# Patient Record
Sex: Female | Born: 1955 | Race: White | Hispanic: No | Marital: Married | Smoking: Never smoker
Health system: Southern US, Community
[De-identification: ages and names within clinical notes are randomized; demographics above are authoritative.]

## PROBLEM LIST (undated history)

## (undated) ENCOUNTER — Ambulatory Visit: Admission: EM | Payer: Self-pay | Source: Home / Self Care

## (undated) DIAGNOSIS — Z923 Personal history of irradiation: Secondary | ICD-10-CM

## (undated) DIAGNOSIS — R51 Headache: Secondary | ICD-10-CM

## (undated) DIAGNOSIS — I214 Non-ST elevation (NSTEMI) myocardial infarction: Secondary | ICD-10-CM

## (undated) DIAGNOSIS — I2510 Atherosclerotic heart disease of native coronary artery without angina pectoris: Secondary | ICD-10-CM

## (undated) DIAGNOSIS — K219 Gastro-esophageal reflux disease without esophagitis: Secondary | ICD-10-CM

## (undated) DIAGNOSIS — M1990 Unspecified osteoarthritis, unspecified site: Secondary | ICD-10-CM

## (undated) DIAGNOSIS — C801 Malignant (primary) neoplasm, unspecified: Secondary | ICD-10-CM

## (undated) DIAGNOSIS — R519 Headache, unspecified: Secondary | ICD-10-CM

## (undated) DIAGNOSIS — I5022 Chronic systolic (congestive) heart failure: Secondary | ICD-10-CM

## (undated) DIAGNOSIS — F419 Anxiety disorder, unspecified: Secondary | ICD-10-CM

## (undated) HISTORY — DX: Anxiety disorder, unspecified: F41.9

## (undated) HISTORY — PX: CHOLECYSTECTOMY: SHX55

## (undated) HISTORY — PX: BILATERAL CARPAL TUNNEL RELEASE: SHX6508

## (undated) HISTORY — DX: Chronic systolic (congestive) heart failure: I50.22

## (undated) HISTORY — DX: Atherosclerotic heart disease of native coronary artery without angina pectoris: I25.10

## (undated) HISTORY — DX: Non-ST elevation (NSTEMI) myocardial infarction: I21.4

---

## 2016-02-02 ENCOUNTER — Other Ambulatory Visit: Payer: Self-pay | Admitting: Obstetrics and Gynecology

## 2016-02-02 DIAGNOSIS — Z1231 Encounter for screening mammogram for malignant neoplasm of breast: Secondary | ICD-10-CM

## 2016-03-01 ENCOUNTER — Ambulatory Visit
Admission: RE | Admit: 2016-03-01 | Discharge: 2016-03-01 | Disposition: A | Discharge status: Home / Self Care | Payer: No Typology Code available for payment source | Source: Ambulatory Visit | Attending: Obstetrics and Gynecology | Admitting: Obstetrics and Gynecology

## 2016-03-01 ENCOUNTER — Encounter (HOSPITAL_COMMUNITY): Payer: Self-pay

## 2016-03-01 DIAGNOSIS — Z1231 Encounter for screening mammogram for malignant neoplasm of breast: Secondary | ICD-10-CM | POA: Diagnosis not present

## 2016-03-14 ENCOUNTER — Other Ambulatory Visit: Payer: Self-pay | Admitting: *Deleted

## 2016-03-14 ENCOUNTER — Ambulatory Visit
Admission: RE | Admit: 2016-03-14 | Discharge: 2016-03-14 | Disposition: A | Discharge status: Home / Self Care | Payer: Self-pay | Source: Ambulatory Visit | Attending: *Deleted | Admitting: *Deleted

## 2016-03-14 DIAGNOSIS — Z9289 Personal history of other medical treatment: Secondary | ICD-10-CM

## 2016-03-17 ENCOUNTER — Other Ambulatory Visit: Payer: Self-pay | Admitting: Obstetrics and Gynecology

## 2016-03-17 DIAGNOSIS — N6320 Unspecified lump in the left breast, unspecified quadrant: Secondary | ICD-10-CM

## 2016-03-23 ENCOUNTER — Ambulatory Visit
Admission: RE | Admit: 2016-03-23 | Discharge: 2016-03-23 | Disposition: A | Discharge status: Home / Self Care | Payer: No Typology Code available for payment source | Source: Ambulatory Visit | Attending: Obstetrics and Gynecology | Admitting: Obstetrics and Gynecology

## 2016-03-23 ENCOUNTER — Other Ambulatory Visit: Payer: Self-pay | Admitting: Obstetrics and Gynecology

## 2016-03-23 DIAGNOSIS — N6320 Unspecified lump in the left breast, unspecified quadrant: Secondary | ICD-10-CM

## 2016-03-23 DIAGNOSIS — C801 Malignant (primary) neoplasm, unspecified: Secondary | ICD-10-CM

## 2016-03-23 HISTORY — DX: Malignant (primary) neoplasm, unspecified: C80.1

## 2016-03-31 ENCOUNTER — Ambulatory Visit
Admission: RE | Admit: 2016-03-31 | Discharge: 2016-03-31 | Disposition: A | Discharge status: Home / Self Care | Payer: No Typology Code available for payment source | Source: Ambulatory Visit | Attending: Obstetrics and Gynecology | Admitting: Obstetrics and Gynecology

## 2016-03-31 ENCOUNTER — Encounter: Payer: Self-pay | Admitting: Oncology

## 2016-03-31 DIAGNOSIS — N6320 Unspecified lump in the left breast, unspecified quadrant: Secondary | ICD-10-CM | POA: Diagnosis present

## 2016-03-31 DIAGNOSIS — C50912 Malignant neoplasm of unspecified site of left female breast: Secondary | ICD-10-CM | POA: Diagnosis not present

## 2016-03-31 HISTORY — PX: BREAST BIOPSY: SHX20

## 2016-04-07 LAB — SURGICAL PATHOLOGY

## 2016-04-07 NOTE — Progress Notes (Signed)
  Oncology Nurse Navigator Documentation  Navigator Location: [facility]-Med Onc (04/07/16 1500) Referral date to RadOnc/MedOnc: 04/12/16 (04/07/16 1500) )Navigator Encounter Type: Introductory phone call (04/07/16 1500)   Abnormal Finding Date: 03/23/16 (04/07/16 1500) Confirmed Diagnosis Date: 03/31/16 (04/07/16 1500)               Patient Visit Type: Initial (04/07/16 1500)                              Time Spent with Patient: 60 (04/07/16 1500)  Per [facility] Radiology, patient has been notified with biopsy results of left breast invasive mammary carcinoma.  Phoned patient to introduce navigation service. Scheduled for consult with Dr. Rochel Brome on 04/08/16 at 3:00, and Dr. Grayland Ormond 04/12/16 at 9:00.  Patient has 5 children.  States her husband is more anxious than she, and he will accompany to Med Onc appointment.

## 2016-04-10 DIAGNOSIS — C50412 Malignant neoplasm of upper-outer quadrant of left female breast: Secondary | ICD-10-CM | POA: Insufficient documentation

## 2016-04-10 NOTE — Progress Notes (Signed)
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  INTERVAL HISTORY: Patient is a 60 year old female who recently underwent her first screening mammogram in approximately 4 years and was found to have an abnormality. Subsequent ultrasound biopsy revealed the above stated breast cancer. Currently, she feels well and is asymptomatic. She has no neurologic complaints. She has a good appetite and denies any weight loss. She has no recent fevers or illnesses. She denies any chest pain or shortness of breath. She denies any nausea, vomiting, constipation, or diarrhea. She has no urinary complaints. Patient feels at her baseline and offers no specific complaints today.  REVIEW OF SYSTEMS:   Review of Systems  Constitutional: Negative.  Negative for fever, malaise/fatigue and weight loss.  Respiratory: Negative.  Negative for cough.   Cardiovascular: Negative.  Negative for chest pain and leg swelling.  Gastrointestinal: Negative.  Negative for abdominal pain.  Genitourinary: Negative.   Musculoskeletal: Negative.   Neurological: Negative.  Negative for weakness.  Psychiatric/Behavioral: Negative.  The patient is not nervous/anxious.     As per HPI. Otherwise, a complete review of systems is negative.  PAST MEDICAL HISTORY: Past Medical History:  Diagnosis Date  . Anxiety     PAST SURGICAL HISTORY: Past Surgical History:  Procedure Laterality Date  . BREAST BIOPSY Left 03/31/2016   path pending  . CHOLECYSTECTOMY      FAMILY HISTORY: Family History  Problem Relation Age of Onset  . Breast cancer Neg Hx     ADVANCED DIRECTIVES (Y/N):  N  HEALTH MAINTENANCE: Social History  Substance Use Topics  . Smoking status:  Never Smoker  . Smokeless tobacco: Not on file  . Alcohol use Yes     Comment: occasional     Colonoscopy:  PAP:  Bone density:  Lipid panel:  No Known Allergies  Current Outpatient Prescriptions  Medication Sig Dispense Refill  . aspirin 81 MG chewable tablet Chew by mouth.    . Probiotic Product ([place]) Take by mouth.     No current facility-administered medications for this visit.     OBJECTIVE: Vitals:   04/12/16 0914  BP: 137/82  Pulse: 64  Resp: 18  Temp: 97.6 F (36.4 C)     Body mass index is 31.09 kg/m.    ECOG FS:0 - Asymptomatic  General: Well-developed, well-nourished, no acute distress. Eyes: Pink conjunctiva, anicteric sclera. HEENT: Normocephalic, moist mucous membranes, clear oropharnyx. Breasts: Patient requested exam be deferred today. Lungs: Clear to auscultation bilaterally. Heart: Regular rate and rhythm. No rubs, murmurs, or gallops. Abdomen: Soft, nontender, nondistended. No organomegaly noted, normoactive bowel sounds. Musculoskeletal: No edema, cyanosis, or clubbing. Neuro: Alert, answering all questions appropriately. Cranial nerves grossly intact. Skin: No rashes or petechiae noted. Psych: Normal affect. Lymphatics: No cervical, calvicular, axillary or inguinal LAD.   LAB RESULTS:  No results found for: NA, K, CL, CO2, GLUCOSE, BUN, CREATININE, CALCIUM, PROT, ALBUMIN, AST, ALT, ALKPHOS, BILITOT, GFRNONAA, GFRAA  No results found for: WBC, NEUTROABS, HGB, HCT, MCV, PLT   STUDIES: US Breast Ltd Uni Left Inc Axilla  Result Date: 03/23/2016 CLINICAL DATA:  The patient returns after screening study for evaluation of possible left breast mass. EXAM: 2D DIGITAL DIAGNOSTIC LEFT MAMMOGRAM WITH CAD AND ADJUNCT TOMO ULTRASOUND  LEFT BREAST COMPARISON:  03/01/2016 and 07/18/2011 ( [facility]) ACR Breast Density Category c: The breast tissue is heterogeneously dense, which may obscure small masses.  FINDINGS: Tomosynthesis images confirm presence of a spiculated mass in the upper-outer quadrant of the right breast. Mammographic images were processed with CAD. On physical exam, I palpate soft thickening in the 2 o'clock location of the left breast. Targeted ultrasound is performed, showing irregular hypoechoic taller than wide mass in the 2 o'clock location of the left breast 4 cm from nipple which measures 1.1 x 1.1 x 0.8 cm. There is adjacent vascularity on Doppler evaluation. No enlarged axillary lymph nodes identified by ultrasound. IMPRESSION: Suspicious mass in the 2 o'clock location of the left breast 4 cm from the nipple corresponding to the spiculated mass identified mammographically. No imaging evidence for adenopathy. RECOMMENDATION: Ultrasound-guided core biopsy of mass in the 2 o'clock location of the left breast. I have discussed the findings and recommendations with the patient. Results were also provided in writing at the conclusion of the visit. If applicable, a reminder letter will be sent to the patient regarding the next appointment. BI-RADS CATEGORY  4: Suspicious. Electronically Signed   By: Nolon Nations M.D.   On: 03/23/2016 12:04   Mm Diag Breast Tomo Uni Left  Result Date: 03/23/2016 CLINICAL DATA:  The patient returns after screening study for evaluation of possible left breast mass. EXAM: 2D DIGITAL DIAGNOSTIC LEFT MAMMOGRAM WITH CAD AND ADJUNCT TOMO ULTRASOUND LEFT BREAST COMPARISON:  03/01/2016 and 07/18/2011 ( [facility]) ACR Breast Density Category c: The breast tissue is heterogeneously dense, which may obscure small masses. FINDINGS: Tomosynthesis images confirm presence of a spiculated mass in the upper-outer quadrant of the right breast. Mammographic images were processed with CAD. On physical exam, I palpate soft thickening in the 2 o'clock location of the left breast. Targeted ultrasound is performed, showing irregular hypoechoic taller than  wide mass in the 2 o'clock location of the left breast 4 cm from nipple which measures 1.1 x 1.1 x 0.8 cm. There is adjacent vascularity on Doppler evaluation. No enlarged axillary lymph nodes identified by ultrasound. IMPRESSION: Suspicious mass in the 2 o'clock location of the left breast 4 cm from the nipple corresponding to the spiculated mass identified mammographically. No imaging evidence for adenopathy. RECOMMENDATION: Ultrasound-guided core biopsy of mass in the 2 o'clock location of the left breast. I have discussed the findings and recommendations with the patient. Results were also provided in writing at the conclusion of the visit. If applicable, a reminder letter will be sent to the patient regarding the next appointment. BI-RADS CATEGORY  4: Suspicious. Electronically Signed   By: Nolon Nations M.D.   On: 03/23/2016 12:04   Mm Clip Placement Left  Result Date: 03/31/2016 CLINICAL DATA:  Evaluate biopsy clip EXAM: DIAGNOSTIC LEFT MAMMOGRAM POST ULTRASOUND BIOPSY COMPARISON:  Previous exam(s). FINDINGS: Mammographic images were obtained following ultrasound guided biopsy of a left breast mass. The coil shaped clip is within the biopsied mass. IMPRESSION: Appropriate clip placement after biopsy. Final Assessment: Post Procedure Mammograms for Marker Placement Electronically Signed   By: Dorise Bullion III M.D   On: 03/31/2016 11:40   [place] Lt Breast Bx W Loc Dev 1st Lesion Img Bx Spec US Guide  Addendum Date: 04/07/2016   ADDENDUM REPORT: 04/07/2016 14:28 ADDENDUM: Pathology of the left breast biopsy revealed LEFT BREAST, 2:00, 4 CMFN; BIOPSY: INVASIVE MAMMARY CARCINOMA OF NO SPECIAL TYPE. PRELIMINARY GRADE:  1 ([place] HISTOLOGIC GRADE). 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 Note: ER, PR and HER2-neu immunohistochemistry is obtained and results will be issued in an addendum. HER2 FISH will be performed for equivocal results. Final histologic grade should be based on  the excised tumor. Results were called to Eino Farber in the Radiology Department on 04/01/16 at 1:53 PM. This was found to be concordant by Dr. Jimmye Norman. Recommendations:  Surgical and oncology referrals. Results and recommendations were relayed to Dr. Migdalia Dk nurse by phone by Eino Farber, RT, M, mammography supervisor at [facility]. The nurse requested the pathology report be faxed to their office. She was going to discuss results with Dr. Leafy Ro and notify Eino Farber as to relaying results to the patient and making referral. The patient was contacted by phone with results and recommendations by Dr. Enriqueta Shutter on 04/06/16. The patient stated she has done well following the biopsy with only bruising which has started to resolve. All of her questions were answered. She was informed that the nurse navigators from [facility] would contact her with referral information. She was encouraged to contact the [facility] with any further questions or concerns. An appointment was made with Dr. Grayland Ormond, oncologist for 04/08/16 at 9:00 AM and Dr. Tamala Julian, surgeon for 04/08/16 at 3:00 PM by Al Pimple, RN, nurse navigator. The patient has been notified of the appointment information. Addendum by Jetta Lout, RRA on 04/07/16. Electronically Signed   By: Franki Cabot M.D.   On: 04/07/2016 14:28   Result Date: 04/07/2016 CLINICAL DATA:  Left breast mass EXAM: ULTRASOUND GUIDED LEFT BREAST CORE NEEDLE BIOPSY COMPARISON:  Previous exam(s). FINDINGS: I met with the patient and we discussed the procedure of ultrasound-guided biopsy, including benefits and alternatives. We discussed the high likelihood of a successful procedure. We discussed the risks of the procedure, including infection, bleeding, tissue injury, clip migration, and inadequate sampling. Informed written consent was given. The usual time-out protocol was performed immediately prior to the procedure.  Using sterile technique and 1% Lidocaine as local anesthetic, under direct ultrasound visualization, a 12 gauge spring-loaded device was used to perform biopsy of a left breast mass using a lateral approach. At the conclusion of the procedure a tissue marker clip was deployed into the biopsy cavity. Follow up 2 view mammogram was performed and dictated separately. IMPRESSION: Ultrasound guided biopsy of a left breast mass. No apparent complications. Electronically Signed: By: Dorise Bullion III M.D On: 03/31/2016 11:39   Mm Outside Films Mammo  Result Date: 03/14/2016 CLINICAL DATA:  This exam is stored here for comparison purposes only and was performed at an outside facility.   Please contact the originating institution for any associated interpretation or report.    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  PLAN:    1. 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 Have ordered Mammoprint to assess whether chemotherapy is necessary or not. Patient has elected for lumpectomy, therefore she will require adjuvant XRT and has an appointment with radiation oncology in mid-December. Also, given the ER/PR status of patient's tumor, she will benefit from an aromatase and from inhibitor for 5 years after completion of her XRT. Patient will return to clinic  on May 09, 2016 to discuss her final pathology results and any additional treatment necessary.  Approximately 45 minutes was spent in discussion of which greater than 50% was consultation.  Patient expressed understanding and was in agreement with this plan. She also understands that She can call clinic at any time with any questions, concerns, or  complaints.   Primary cancer of upper outer quadrant of left female breast [facility])   Staging form: Breast, AJCC 7th Edition   - Clinical stage from 04/10/2016: Stage IA (T1c, N0, M0) - Signed by Lloyd Huger, MD on 04/10/2016  Lloyd Huger, MD   04/12/2016 1:19 PM

## 2016-04-11 ENCOUNTER — Other Ambulatory Visit: Payer: Self-pay | Admitting: Surgery

## 2016-04-11 DIAGNOSIS — C50412 Malignant neoplasm of upper-outer quadrant of left female breast: Secondary | ICD-10-CM

## 2016-04-12 ENCOUNTER — Encounter: Payer: Self-pay | Admitting: Oncology

## 2016-04-12 ENCOUNTER — Encounter (INDEPENDENT_AMBULATORY_CARE_PROVIDER_SITE_OTHER): Payer: Self-pay

## 2016-04-12 ENCOUNTER — Inpatient Hospital Stay: Payer: No Typology Code available for payment source | Attending: Oncology | Admitting: Oncology

## 2016-04-12 ENCOUNTER — Encounter: Payer: Self-pay | Admitting: Surgical Oncology

## 2016-04-12 DIAGNOSIS — Z170 Estrogen receptor positive status [ER+]: Secondary | ICD-10-CM

## 2016-04-12 DIAGNOSIS — F419 Anxiety disorder, unspecified: Secondary | ICD-10-CM | POA: Insufficient documentation

## 2016-04-12 DIAGNOSIS — Z79899 Other long term (current) drug therapy: Secondary | ICD-10-CM | POA: Insufficient documentation

## 2016-04-12 DIAGNOSIS — C50412 Malignant neoplasm of upper-outer quadrant of left female breast: Secondary | ICD-10-CM

## 2016-04-12 DIAGNOSIS — Z7982 Long term (current) use of aspirin: Secondary | ICD-10-CM | POA: Insufficient documentation

## 2016-04-22 NOTE — Patient Instructions (Signed)
  Your procedure is scheduled on: 04-29-16 (FRIDAY) Report to [place] @ 8:30 AM   Remember: Instructions that are not followed completely may result in serious medical risk, up to and including death, or upon the discretion of your surgeon and anesthesiologist your surgery may need to be rescheduled.    _x___ 1. Do not eat food or drink liquids after midnight. No gum chewing or hard candies.     __x__ 2. No Alcohol for 24 hours before or after surgery.   __x__3. No Smoking for 24 prior to surgery.   ____  4. Bring all medications with you on the day of surgery if instructed.    __x__ 5. Notify your doctor if there is any change in your medical condition     (cold, fever, infections).     Do not wear jewelry, make-up, hairpins, clips or nail polish.  Do not wear lotions, powders, or perfumes. You may wear deodorant.  Do not shave 48 hours prior to surgery. Men may shave face and neck.  Do not bring valuables to the hospital.    [facility] is not responsible for any belongings or valuables.               Contacts, dentures or bridgework may not be worn into surgery.  Leave your suitcase in the car. After surgery it may be brought to your room.  For patients admitted to the hospital, discharge time is determined by your treatment team.   Patients discharged the day of surgery will not be allowed to drive home.  You will need someone to drive you home and stay with you the night of your procedure.    Please read over the following fact sheets that you were given:   [facility] Preparing for Surgery and or MRSA Information   ____ Take these medicines the morning of surgery with A SIP OF WATER:    1. NONE  2.  3.  4.  5.  6.  ____Fleets enema or Magnesium Citrate as directed.   _x___ Use CHG Soap or sage wipes as directed on instruction sheet   ____ Use inhalers on the day of surgery and bring to hospital day of surgery  ____ Stop metformin 2 days prior to  surgery    ____ Take 1/2 of usual insulin dose the night before surgery and none on the morning of surgery.   _X___ Stop Aspirin, Coumadin, Pllavix ,Eliquis, Effient, or Pradaxa-STOP ASPIRIN NOW  x__ Stop Anti-inflammatories such as Advil, Aleve, Ibuprofen, Motrin, Naproxen,          Naprosyn, Goodies powders or aspirin products NOW-Ok to take Tylenol.   _X___ Stop supplements until after surgery-STOP PROBIOTIC NOW  ____ Bring C-Pap to the hospital.

## 2016-04-25 ENCOUNTER — Encounter
Admission: RE | Admit: 2016-04-25 | Discharge: 2016-04-25 | Disposition: A | Discharge status: Home / Self Care | Payer: PRIVATE HEALTH INSURANCE | Source: Ambulatory Visit | Attending: Surgery | Admitting: Surgery

## 2016-04-25 DIAGNOSIS — N6002 Solitary cyst of left breast: Secondary | ICD-10-CM | POA: Diagnosis not present

## 2016-04-25 DIAGNOSIS — F419 Anxiety disorder, unspecified: Secondary | ICD-10-CM | POA: Diagnosis not present

## 2016-04-25 DIAGNOSIS — Z01812 Encounter for preprocedural laboratory examination: Secondary | ICD-10-CM | POA: Insufficient documentation

## 2016-04-25 DIAGNOSIS — M1990 Unspecified osteoarthritis, unspecified site: Secondary | ICD-10-CM | POA: Diagnosis not present

## 2016-04-25 DIAGNOSIS — E669 Obesity, unspecified: Secondary | ICD-10-CM | POA: Diagnosis not present

## 2016-04-25 DIAGNOSIS — Z170 Estrogen receptor positive status [ER+]: Secondary | ICD-10-CM | POA: Diagnosis not present

## 2016-04-25 DIAGNOSIS — C50912 Malignant neoplasm of unspecified site of left female breast: Secondary | ICD-10-CM | POA: Diagnosis not present

## 2016-04-25 DIAGNOSIS — Z6831 Body mass index (BMI) 31.0-31.9, adult: Secondary | ICD-10-CM | POA: Diagnosis not present

## 2016-04-25 DIAGNOSIS — K219 Gastro-esophageal reflux disease without esophagitis: Secondary | ICD-10-CM | POA: Diagnosis not present

## 2016-04-25 DIAGNOSIS — C50412 Malignant neoplasm of upper-outer quadrant of left female breast: Secondary | ICD-10-CM | POA: Insufficient documentation

## 2016-04-25 HISTORY — DX: Unspecified osteoarthritis, unspecified site: M19.90

## 2016-04-25 HISTORY — DX: Headache: R51

## 2016-04-25 HISTORY — DX: Malignant (primary) neoplasm, unspecified: C80.1

## 2016-04-25 HISTORY — DX: Gastro-esophageal reflux disease without esophagitis: K21.9

## 2016-04-25 HISTORY — DX: Headache, unspecified: R51.9

## 2016-04-25 LAB — POTASSIUM: POTASSIUM: 3.9 mmol/L (ref 3.5–5.1)

## 2016-04-29 ENCOUNTER — Encounter: Payer: Self-pay | Admitting: *Deleted

## 2016-04-29 ENCOUNTER — Ambulatory Visit
Admission: RE | Admit: 2016-04-29 | Discharge: 2016-04-29 | Disposition: A | Discharge status: Home / Self Care | Payer: No Typology Code available for payment source | Source: Ambulatory Visit | Attending: Surgery | Admitting: Surgery

## 2016-04-29 ENCOUNTER — Ambulatory Visit: Payer: No Typology Code available for payment source | Admitting: Anesthesiology

## 2016-04-29 ENCOUNTER — Encounter
Admission: RE | Disposition: A | Discharge status: Home / Self Care | Payer: Self-pay | Source: Ambulatory Visit | Attending: Surgery

## 2016-04-29 DIAGNOSIS — E669 Obesity, unspecified: Secondary | ICD-10-CM | POA: Insufficient documentation

## 2016-04-29 DIAGNOSIS — C50912 Malignant neoplasm of unspecified site of left female breast: Secondary | ICD-10-CM | POA: Diagnosis not present

## 2016-04-29 DIAGNOSIS — C50412 Malignant neoplasm of upper-outer quadrant of left female breast: Secondary | ICD-10-CM

## 2016-04-29 DIAGNOSIS — M1990 Unspecified osteoarthritis, unspecified site: Secondary | ICD-10-CM | POA: Insufficient documentation

## 2016-04-29 DIAGNOSIS — N6002 Solitary cyst of left breast: Secondary | ICD-10-CM | POA: Insufficient documentation

## 2016-04-29 DIAGNOSIS — Z170 Estrogen receptor positive status [ER+]: Secondary | ICD-10-CM | POA: Insufficient documentation

## 2016-04-29 DIAGNOSIS — F419 Anxiety disorder, unspecified: Secondary | ICD-10-CM | POA: Insufficient documentation

## 2016-04-29 DIAGNOSIS — K219 Gastro-esophageal reflux disease without esophagitis: Secondary | ICD-10-CM | POA: Insufficient documentation

## 2016-04-29 DIAGNOSIS — Z6831 Body mass index (BMI) 31.0-31.9, adult: Secondary | ICD-10-CM | POA: Insufficient documentation

## 2016-04-29 HISTORY — PX: BREAST LUMPECTOMY: SHX2

## 2016-04-29 HISTORY — PX: SENTINEL NODE BIOPSY: SHX6608

## 2016-04-29 HISTORY — PX: PARTIAL MASTECTOMY WITH NEEDLE LOCALIZATION: SHX6008

## 2016-04-29 SURGERY — PARTIAL MASTECTOMY WITH NEEDLE LOCALIZATION
Anesthesia: General | Laterality: Left

## 2016-04-29 MED ORDER — TECHNETIUM TC 99M SULFUR COLLOID
1.1000 | Freq: Once | INTRAVENOUS | Status: AC | PRN
  Administered 2016-04-29: 1.1 via INTRAVENOUS

## 2016-04-29 MED ORDER — BUPIVACAINE HCL (PF) 0.5 % IJ SOLN
INTRAMUSCULAR | Status: AC
  Filled 2016-04-29: qty 30

## 2016-04-29 MED ORDER — PHENYLEPHRINE HCL 10 MG/ML IJ SOLN
INTRAMUSCULAR | Status: DC | PRN
  Administered 2016-04-29 (×6): 100 ug via INTRAVENOUS

## 2016-04-29 MED ORDER — FENTANYL CITRATE (PF) 100 MCG/2ML IJ SOLN
25.0000 ug | INTRAMUSCULAR | Status: DC | PRN
  Administered 2016-04-29: 50 ug via INTRAVENOUS
  Administered 2016-04-29: 25 ug via INTRAVENOUS
  Administered 2016-04-29: 50 ug via INTRAVENOUS
  Administered 2016-04-29: 25 ug via INTRAVENOUS

## 2016-04-29 MED ORDER — LACTATED RINGERS IV SOLN
INTRAVENOUS | Status: DC
  Administered 2016-04-29 (×2): via INTRAVENOUS

## 2016-04-29 MED ORDER — ONDANSETRON HCL 4 MG/2ML IJ SOLN
INTRAMUSCULAR | Status: DC | PRN
  Administered 2016-04-29: 4 mg via INTRAVENOUS

## 2016-04-29 MED ORDER — OXYCODONE HCL 5 MG/5ML PO SOLN: 5.0000 mg | Freq: Once | ORAL | Status: AC | PRN

## 2016-04-29 MED ORDER — PROMETHAZINE HCL 25 MG/ML IJ SOLN: 6.2500 mg | INTRAMUSCULAR | Status: DC | PRN

## 2016-04-29 MED ORDER — MEPERIDINE HCL 25 MG/ML IJ SOLN: 6.2500 mg | INTRAMUSCULAR | Status: DC | PRN

## 2016-04-29 MED ORDER — GLYCOPYRROLATE 0.2 MG/ML IJ SOLN
INTRAMUSCULAR | Status: DC | PRN
  Administered 2016-04-29: .2 mg via INTRAVENOUS

## 2016-04-29 MED ORDER — EPINEPHRINE 30 MG/30ML IJ SOLN
INTRAMUSCULAR | Status: AC
  Filled 2016-04-29: qty 1

## 2016-04-29 MED ORDER — FENTANYL CITRATE (PF) 100 MCG/2ML IJ SOLN
INTRAMUSCULAR | Status: AC
  Administered 2016-04-29: 50 ug via INTRAVENOUS
  Filled 2016-04-29: qty 2

## 2016-04-29 MED ORDER — MIDAZOLAM HCL 2 MG/2ML IJ SOLN
INTRAMUSCULAR | Status: DC | PRN
  Administered 2016-04-29: 2 mg via INTRAVENOUS

## 2016-04-29 MED ORDER — FENTANYL CITRATE (PF) 100 MCG/2ML IJ SOLN
INTRAMUSCULAR | Status: AC
  Administered 2016-04-29: 25 ug via INTRAVENOUS
  Filled 2016-04-29: qty 2

## 2016-04-29 MED ORDER — FENTANYL CITRATE (PF) 100 MCG/2ML IJ SOLN
INTRAMUSCULAR | Status: DC | PRN
  Administered 2016-04-29: 50 ug via INTRAVENOUS
  Administered 2016-04-29 (×2): 25 ug via INTRAVENOUS

## 2016-04-29 MED ORDER — HYDROCODONE-ACETAMINOPHEN 5-325 MG PO TABS: 1.0000 | ORAL_TABLET | ORAL | 0 refills | Status: DC | PRN

## 2016-04-29 MED ORDER — KETOROLAC TROMETHAMINE 30 MG/ML IJ SOLN
INTRAMUSCULAR | Status: DC | PRN
  Administered 2016-04-29: 30 mg via INTRAVENOUS

## 2016-04-29 MED ORDER — HYDROCODONE-ACETAMINOPHEN 5-325 MG PO TABS: 1.0000 | ORAL_TABLET | ORAL | Status: DC | PRN

## 2016-04-29 MED ORDER — LIDOCAINE HCL (CARDIAC) 20 MG/ML IV SOLN
INTRAVENOUS | Status: DC | PRN
  Administered 2016-04-29: 40 mg via INTRAVENOUS

## 2016-04-29 MED ORDER — EPINEPHRINE PF 1 MG/ML IJ SOLN
INTRAMUSCULAR | Status: AC
  Filled 2016-04-29: qty 1

## 2016-04-29 MED ORDER — FAMOTIDINE 20 MG PO TABS
ORAL_TABLET | ORAL | Status: AC
  Filled 2016-04-29: qty 1

## 2016-04-29 MED ORDER — DEXAMETHASONE SODIUM PHOSPHATE 10 MG/ML IJ SOLN
INTRAMUSCULAR | Status: DC | PRN
  Administered 2016-04-29: 8 mg via INTRAVENOUS

## 2016-04-29 MED ORDER — OXYCODONE HCL 5 MG PO TABS
5.0000 mg | ORAL_TABLET | Freq: Once | ORAL | Status: AC | PRN
  Administered 2016-04-29: 5 mg via ORAL

## 2016-04-29 MED ORDER — BUPIVACAINE-EPINEPHRINE 0.5% -1:200000 IJ SOLN
INTRAMUSCULAR | Status: DC | PRN
  Administered 2016-04-29: 13 mL

## 2016-04-29 MED ORDER — OXYCODONE HCL 5 MG PO TABS
ORAL_TABLET | ORAL | Status: AC
  Filled 2016-04-29: qty 1

## 2016-04-29 MED ORDER — FAMOTIDINE 20 MG PO TABS
20.0000 mg | ORAL_TABLET | Freq: Once | ORAL | Status: AC
  Administered 2016-04-29: 20 mg via ORAL

## 2016-04-29 MED ORDER — PROPOFOL 10 MG/ML IV BOLUS
INTRAVENOUS | Status: DC | PRN
  Administered 2016-04-29: 130 mg via INTRAVENOUS
  Administered 2016-04-29: 40 mg via INTRAVENOUS

## 2016-04-29 SURGICAL SUPPLY — 32 items
BLADE SURG 15 STRL LF DISP TIS (BLADE) ×1 IMPLANT
BLADE SURG 15 STRL SS (BLADE) ×2
CANISTER SUCT 1200ML W/VALVE (MISCELLANEOUS) ×3 IMPLANT
CHLORAPREP W/TINT 26ML (MISCELLANEOUS) ×3 IMPLANT
CNTNR SPEC 2.5X3XGRAD LEK (MISCELLANEOUS) ×2
CONT SPEC 4OZ STER OR WHT (MISCELLANEOUS) ×4
CONTAINER SPEC 2.5X3XGRAD LEK (MISCELLANEOUS) ×2 IMPLANT
DERMABOND ADVANCED (GAUZE/BANDAGES/DRESSINGS) ×2
DERMABOND ADVANCED .7 DNX12 (GAUZE/BANDAGES/DRESSINGS) ×1 IMPLANT
DEVICE DUBIN SPECIMEN MAMMOGRA (MISCELLANEOUS) ×3 IMPLANT
DRAPE LAPAROTOMY 77X122 PED (DRAPES) ×3 IMPLANT
ELECT REM PT RETURN 9FT ADLT (ELECTROSURGICAL) ×3
ELECTRODE REM PT RTRN 9FT ADLT (ELECTROSURGICAL) ×1 IMPLANT
GLOVE BIO SURGEON STRL SZ7.5 (GLOVE) ×3 IMPLANT
GOWN STRL REUS W/ TWL LRG LVL3 (GOWN DISPOSABLE) ×2 IMPLANT
GOWN STRL REUS W/TWL LRG LVL3 (GOWN DISPOSABLE) ×4
KIT RM TURNOVER STRD PROC AR (KITS) ×3 IMPLANT
LABEL OR SOLS (LABEL) ×3 IMPLANT
MARGIN MAP 10MM (MISCELLANEOUS) ×3 IMPLANT
NDL SAFETY 18GX1.5 (NEEDLE) ×3 IMPLANT
NDL SAFETY 22GX1.5 (NEEDLE) ×3 IMPLANT
NEEDLE HYPO 25X1 1.5 SAFETY (NEEDLE) ×3 IMPLANT
PACK BASIN MINOR ARMC (MISCELLANEOUS) ×3 IMPLANT
SLEVE PROBE SENORX GAMMA FIND (MISCELLANEOUS) ×3 IMPLANT
SUT CHROMIC 4 0 RB 1X27 (SUTURE) ×3 IMPLANT
SUT ETHILON 3-0 FS-10 30 BLK (SUTURE) ×3
SUT MNCRL 4-0 (SUTURE) ×2
SUT MNCRL 4-0 27XMFL (SUTURE) ×1
SUTURE EHLN 3-0 FS-10 30 BLK (SUTURE) ×1 IMPLANT
SUTURE MNCRL 4-0 27XMF (SUTURE) ×1 IMPLANT
SYRINGE 10CC LL (SYRINGE) ×3 IMPLANT
WATER STERILE IRR 1000ML POUR (IV SOLUTION) ×3 IMPLANT

## 2016-04-29 NOTE — H&P (Signed)
  She reports no change in overall condition since the day of the office exam.  She has had preoperative x-ray needle localization. I have reviewed the mammogram images. She also has had injection of radioactive active technetium sulfur colloid.  The dressing was identified on the left breast and also the left side was marked YES  Lab work was reviewed with the patient. I have discussed the plan for left partial mastectomy with sentinel lymph node biopsy

## 2016-04-29 NOTE — Anesthesia Preprocedure Evaluation (Signed)
Anesthesia Evaluation  Patient identified by MRN, date of birth, ID band Patient awake    Reviewed: Allergy & Precautions, NPO status , Patient's Chart, lab work & pertinent test results  History of Anesthesia Complications Negative for: history of anesthetic complications  Airway Mallampati: II  TM Distance: >3 FB Neck ROM: Full    Dental no notable dental hx.    Pulmonary neg pulmonary ROS, neg sleep apnea, neg COPD,    breath sounds clear to auscultation- rhonchi (-) wheezing      Cardiovascular Exercise Tolerance: Good (-) hypertension(-) CAD and (-) Past MI  Rhythm:Regular Rate:Normal - Systolic murmurs and - Diastolic murmurs    Neuro/Psych  Headaches, Anxiety    GI/Hepatic Neg liver ROS, GERD  ,  Endo/Other  negative endocrine ROSneg diabetes  Renal/GU negative Renal ROS     Musculoskeletal  (+) Arthritis ,   Abdominal (+) + obese,   Peds  Hematology negative hematology ROS (+)   Anesthesia Other Findings Past Medical History: No date: Anxiety No date: Arthritis     Comment: FINGERS No date: Cancer ([place])     Comment: BREAST No date: GERD (gastroesophageal reflux disease) No date: Headache     Comment: H/O MIGRAINES   Reproductive/Obstetrics                             Anesthesia Physical Anesthesia Plan  ASA: II  Anesthesia Plan: General   Post-op Pain Management:    Induction: Intravenous  Airway Management Planned: LMA  Additional Equipment:   Intra-op Plan:   Post-operative Plan:   Informed Consent: I have reviewed the patients History and Physical, chart, labs and discussed the procedure including the risks, benefits and alternatives for the proposed anesthesia with the patient or authorized representative who has indicated his/her understanding and acceptance.   Dental advisory given  Plan Discussed with: CRNA and Anesthesiologist  Anesthesia Plan  Comments:         Anesthesia Quick Evaluation

## 2016-04-29 NOTE — Anesthesia Procedure Notes (Signed)
Procedure Name: LMA Insertion Date/Time: 04/29/2016 12:10 PM Performed by: Allean Found Pre-anesthesia Checklist: Patient identified, Emergency Drugs available, Suction available, Patient being monitored and Timeout performed Patient Re-evaluated:Patient Re-evaluated prior to inductionOxygen Delivery Method: Circle system utilized Preoxygenation: Pre-oxygenation with 100% oxygen Intubation Type: IV induction Ventilation: Mask ventilation without difficulty LMA: LMA inserted LMA Size: 4.0 Number of attempts: 1 Placement Confirmation: positive ETCO2 Tube secured with: Tape Dental Injury: Teeth and Oropharynx as per pre-operative assessment

## 2016-04-29 NOTE — Op Note (Signed)
OPERATIVE REPORT  PREOPERATIVE  DIAGNOSIS: . Left breast cancer  POSTOPERATIVE DIAGNOSIS: . Left breast cancer  PROCEDURE: . Left partial mastectomy with axillary sentinel lymph node biopsy  ANESTHESIA:  General  SURGEON: Rochel Brome  MD   INDICATIONS: . She had recent findings of a mass in the lateral aspect of the left breast at the 2:00 position. Core biopsy demonstrated infiltrating mammary carcinoma. Surgery was recommended for definitive treatment. She did have preoperative  insertion of Kopan's wire. She also had preoperative injection of radioactive technetium sulfur colloid.  With the patient on the operating table in the supine position under general anesthesia of the left arm was placed on a lateral arm support. The dressing was removed from the lateral aspect of the left breast exposing the Kopan's wire which was cut 2 cm from the skin. The breast was prepared with ChloraPrep and draped in a sterile manner. A curvilinear incision was made from approximately 1:00 to 3:00 position of the breast removing a narrow ellipse of skin and carried down through subcutaneous tissues to encounter the Kopan's wire. There was a palpable mass within the tissues at approximately 2:00 position. Dissection was carried out around this mass removing normal appearing tissue around the mass. Several small cysts were identified. Sutures were used to attach markers to the specimen to mark the cranial caudal medial lateral and deep margins.  The specimen was submitted for specimen mammogram and pathology. The radiologist called to report that the biopsy marker was seen centrally located in the specimen. The pathologist called to report that the cancer was identified and margins appeared to be good.  The gamma counter was used to demonstrate the location of radioactivity in the inferior aspect of the left axilla. An oblique incision was made in the inferior aspect of the axilla some 4 cm in length and carried  down through subcutaneous tissues deeply within the axilla adjacent to the rib cage and found a localized area of radioactivity. There was a lymph node which was dissected free from surrounding structures with some surrounding fatty tissue and was removed. The lymph node appeared to be normal size and was soft. The ex vivo count was in the range of 450-550 counts per second. The background count was less than 16. There was no remaining palpable mass within the axilla. One clamped bleeding point was suture ligated with 3-0 chromic. Hemostasis was intact.  The partial mastectomy wound was inspected and found hemostasis was intact. The subcutaneous tissues were infiltrated with half percent Sensorcaine with epinephrine. Subcutaneous tissues were approximated with interrupted 4-0 chromic. The skin was closed with running 4-0 Monocryl subcuticular sutures. The axillary wound was inspected and hemostasis was intact. Subcutaneous tissues were infiltrated with half percent Sensorcaine with epinephrine. The skin was closed with running 4-0 Monocryl subcuticular sutures. Both wounds were dressed with Dermabond and allowed to dry. The patient tolerated surgery satisfactorily and was prepared for transfer to the recovery room.  Rochel Brome M.D.

## 2016-04-29 NOTE — Discharge Instructions (Addendum)
Take Tylenol or Norco if needed for pain.  Should not drive or do anything dangerous when taking Norco.  May resume aspirin on Monday.  May shower.  Wear bra as desired for support and comfort.    AMBULATORY SURGERY  DISCHARGE INSTRUCTIONS   1) The drugs that you were given will stay in your system until tomorrow so for the next 24 hours you should not:  A) Drive an automobile B) Make any legal decisions C) Drink any alcoholic beverage   2) You may resume regular meals tomorrow.  Today it is better to start with liquids and gradually work up to solid foods.  You may eat anything you prefer, but it is better to start with liquids, then soup and crackers, and gradually work up to solid foods.   3) Please notify your doctor immediately if you have any unusual bleeding, trouble breathing, redness and pain at the surgery site, drainage, fever, or pain not relieved by medication.    4) Additional Instructions:        Please contact your physician with any problems or Same Day Surgery at [phone], Monday through Friday 6 am to 4 pm, or [place] at [facility] number at [phone].

## 2016-04-29 NOTE — Transfer of Care (Signed)
Immediate Anesthesia Transfer of Care Note  Patient: Jill Reilly  Procedure(s) Performed: Procedure(s): PARTIAL MASTECTOMY WITH NEEDLE LOCALIZATION (Left) SENTINEL NODE BIOPSY (Left)  Patient Location: PACU  Anesthesia Type:General  Level of Consciousness: sedated  Airway & Oxygen Therapy: Patient Spontanous Breathing and Patient connected to face mask oxygen  Post-op Assessment: Report given to RN and Post -op Vital signs reviewed and stable  Post vital signs: Reviewed and stable  Last Vitals:  Vitals:   04/29/16 1021  BP: (!) 154/85  Pulse: 68  Temp: 36.5 C    Last Pain:  Vitals:   04/29/16 1021  TempSrc: Oral         Complications: No apparent anesthesia complications

## 2016-05-02 ENCOUNTER — Encounter: Payer: Self-pay | Admitting: Surgery

## 2016-05-02 LAB — SURGICAL PATHOLOGY

## 2016-05-02 NOTE — Anesthesia Postprocedure Evaluation (Signed)
Anesthesia Post Note  Patient: Carmel E Folkerts  Procedure(s) Performed: Procedure(s) (LRB): PARTIAL MASTECTOMY WITH NEEDLE LOCALIZATION (Left) SENTINEL NODE BIOPSY (Left)  Patient location during evaluation: PACU Anesthesia Type: General Level of consciousness: awake and alert Pain management: pain level controlled Vital Signs Assessment: post-procedure vital signs reviewed and stable Respiratory status: spontaneous breathing, nonlabored ventilation, respiratory function stable and patient connected to nasal cannula oxygen Cardiovascular status: blood pressure returned to baseline and stable Postop Assessment: no signs of nausea or vomiting Anesthetic complications: no    Last Vitals:  Vitals:   04/29/16 1510 04/29/16 1620  BP: (!) 129/92 123/79  Pulse: 84 86  Resp: 14 16  Temp: 36.4 C 36.3 C    Last Pain:  Vitals:   04/29/16 1620  TempSrc:   PainSc: 2                  Precious Haws Julionna Marczak

## 2016-05-08 NOTE — Progress Notes (Signed)
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 Low risk Mammoprint.  INTERVAL HISTORY: Patient returns to clinic today for further evaluation and treatment planning. Currently, she feels well and is asymptomatic. On December 8th she had a left partial mastectomy with axillary sentinel lymph node biopsy by Dr. Rochel Brome. She is feeling well post op and only complains of left arm "soreness". She has no neurologic complaints. She has a good appetite and denies any weight loss. She has no recent fevers or illnesses. She denies any chest pain or shortness of breath. She denies any nausea, vomiting, constipation, or diarrhea. She has no urinary complaints. Patient feels at her baseline and offers no specific complaints today.  REVIEW OF SYSTEMS:   Review of Systems  Constitutional: Negative.  Negative for fever, malaise/fatigue and weight loss.  Respiratory: Negative.  Negative for cough.   Cardiovascular: Negative.  Negative for chest pain and leg swelling.  Gastrointestinal: Negative.  Negative for abdominal pain.  Genitourinary: Negative.   Musculoskeletal: Negative.   Neurological: Negative.  Negative for weakness.  Psychiatric/Behavioral: Negative.  The patient is not nervous/anxious.     As per HPI. Otherwise, a complete review of systems is negative.  PAST MEDICAL HISTORY: Past Medical History:  Diagnosis Date  . Anxiety   . Arthritis    FINGERS  . Cancer (HCC)    BREAST  . GERD (gastroesophageal reflux disease)   . Headache    H/O MIGRAINES    PAST SURGICAL HISTORY: Past Surgical History:  Procedure Laterality Date  . BILATERAL CARPAL TUNNEL RELEASE    . BREAST  BIOPSY Left 03/31/2016   path pending  . CESAREAN SECTION    . CHOLECYSTECTOMY    . PARTIAL MASTECTOMY WITH NEEDLE LOCALIZATION Left 04/29/2016   Procedure: PARTIAL MASTECTOMY WITH NEEDLE LOCALIZATION;  Surgeon: Leonie Green, MD;  Location: [facility] ORS;  Service: General;  Laterality: Left;  . SENTINEL NODE BIOPSY Left 04/29/2016   Procedure: SENTINEL NODE BIOPSY;  Surgeon: Leonie Green, MD;  Location: [facility] ORS;  Service: General;  Laterality: Left;    FAMILY HISTORY: Family History  Problem Relation Age of Onset  . Breast cancer Neg Hx     ADVANCED DIRECTIVES (Y/N):  N  HEALTH MAINTENANCE: Social History  Substance Use Topics  . Smoking status: Never Smoker  . Smokeless tobacco: Never Used  . Alcohol use Yes     Comment: occasional     Colonoscopy:  PAP:  Bone density:  Lipid panel:  No Known Allergies  Current Outpatient Prescriptions  Medication Sig Dispense Refill  . acetaminophen (TYLENOL) 500 MG tablet Take 1,000 mg by mouth every 6 (six) hours as needed for moderate pain or headache.    Marland Kitchen aspirin EC 81 MG tablet Take 81 mg by mouth daily.    . Ca Carbonate-Mag Hydroxide (ROLAIDS PO) Take 1 tablet by mouth as needed.    Marland Kitchen HYDROcodone-acetaminophen (NORCO) 5-325 MG tablet Take 1-2 tablets by mouth every 4 (four) hours as needed for moderate pain. 12 tablet 0  . Probiotic Product (PHILLIPS COLON HEALTH PO) Take 1 capsule by mouth daily.     Marland Kitchen ibuprofen (ADVIL,MOTRIN) 200 MG tablet Take 400 mg by mouth every 6 (six)  hours as needed for headache or moderate pain.     No current facility-administered medications for this visit.     OBJECTIVE: Vitals:   05/09/16 0939  BP: (!) 154/92  Pulse: 79  Resp: 18  Temp: 97.2 F (36.2 C)     Body mass index is 31.52 kg/m.    ECOG FS:0 - Asymptomatic  General: Well-developed, well-nourished, no acute distress. Eyes: Pink conjunctiva, anicteric sclera. Breasts: Patient requested exam be deferred today. Lungs:  Clear to auscultation bilaterally. Heart: Regular rate and rhythm. No rubs, murmurs, or gallops. Abdomen: Soft, nontender, nondistended. No organomegaly noted, normoactive bowel sounds. Musculoskeletal: No edema, cyanosis, or clubbing. Neuro: Alert, answering all questions appropriately. Cranial nerves grossly intact. Skin: No rashes or petechiae noted. Psych: Normal affect.   LAB RESULTS:  Lab Results  Component Value Date   K 3.9 04/25/2016    No results found for: WBC, NEUTROABS, HGB, HCT, MCV, PLT   STUDIES: Nm Sentinel Node Injection  Result Date: 04/29/2016 CLINICAL DATA:  Left breast cancer. EXAM: NUCLEAR MEDICINE BREAST LYMPHOSCINTIGRAPHY TECHNIQUE: Intradermal injection of radiopharmaceutical was performed at the 3 o'clock position around the left nipple. The patient was then sent to the operating room where the sentinel node(s) were identified and removed by the surgeon. RADIOPHARMACEUTICALS:  Total of 1 mCi Millipore-filtered Technetium-31msulfur colloid. IMPRESSION: Uncomplicated intradermal injection of a total of 1 mCi Technetium-960mulfur colloid for purposes of sentinel node identification. Electronically Signed   By: GlAletta Edouard.D.   On: 04/29/2016 12:11   Mm Breast Surgical Specimen  Result Date: 04/29/2016 CLINICAL DATA:  Status post left breast lumpectomy after earlier needle localization. EXAM: SPECIMEN RADIOGRAPH OF THE LEFT BREAST COMPARISON:  Previous exam(s). FINDINGS: Status post excision of the left breast. The wire tip and biopsy marker clip are present and are marked for pathology. IMPRESSION: Specimen radiograph of the left breast. Electronically Signed   By: StFranki Cabot.D.   On: 04/29/2016 13:25   Mm Lt Plc Breast Loc Dev   1st Lesion  Inc Mammo Guide  Result Date: 04/29/2016 CLINICAL DATA:  Recent biopsy-proven left breast carcinoma for lumpectomy today requiring preoperative needle localization. EXAM: NEEDLE LOCALIZATION OF THE LEFT BREAST  WITH MAMMO GUIDANCE COMPARISON:  Previous exams. FINDINGS: Patient presents for needle localization prior to lumpectomy. I met with the patient and we discussed the procedure of needle localization including benefits and alternatives. We discussed the high likelihood of a successful procedure. We discussed the risks of the procedure, including infection, bleeding, tissue injury, and further surgery. Informed, written consent was given. The usual time-out protocol was performed immediately prior to the procedure. Using mammographic guidance, sterile technique, 1% lidocaine and a 7 cm modified Kopans needle, the biopsy clip within the outer left breast was localized using a lateral approach. The images were marked for Dr. SmTamala JulianIMPRESSION: Needle localization left breast. No apparent complications. Electronically Signed   By: StFranki Cabot.D.   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 Low risk Mammoprint.  PLAN:    1. 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 Low risk Mammoprint: Given the size and stage patient's tumor, she would benefit from initial treatment with lumpectomy which was performed on Dec 8th 2017. Mammoprint for patient identified her as low risk. She will require adjuvant XRT and has  an appointment with Dr. Baruch Gouty January 4th for simulation and the start of adjuvant XRT. Also, given the ER/PR status of patient's tumor, she will benefit from an aromatase and from inhibitor for 5 years after completion of her XRT. Will have follow-up scheduled in three months or at the end of her radiation.   Approximately 30 minutes was spent in discussion of which greater than 50% was consultation.  Patient expressed understanding and was in agreement with this plan. She also understands that She can call clinic at  any time with any questions, concerns, or complaints.   Primary cancer of upper outer quadrant of left female breast [facility])   Staging form: Breast, AJCC 7th Edition   - Pathologic stage from 05/09/2016: Stage IA (T1c, N0, cM0) - Signed by Lloyd Huger, MD on 05/09/2016   Faythe Casa, NP   Patient was seen and evaluated independently and I agree with the assessment and plan as dictated above. Patient will also require baseline bone mineral density upon initiation of her aromatase inhibitor.  Lloyd Huger, MD 05/09/16 4:33 PM

## 2016-05-09 ENCOUNTER — Inpatient Hospital Stay: Payer: PRIVATE HEALTH INSURANCE | Attending: Oncology | Admitting: Oncology

## 2016-05-09 VITALS — BP 154/92 | HR 79 | Temp 97.2°F | Resp 18 | Wt 177.9 lb

## 2016-05-09 DIAGNOSIS — K219 Gastro-esophageal reflux disease without esophagitis: Secondary | ICD-10-CM | POA: Insufficient documentation

## 2016-05-09 DIAGNOSIS — Z170 Estrogen receptor positive status [ER+]: Secondary | ICD-10-CM | POA: Insufficient documentation

## 2016-05-09 DIAGNOSIS — M1990 Unspecified osteoarthritis, unspecified site: Secondary | ICD-10-CM | POA: Diagnosis not present

## 2016-05-09 DIAGNOSIS — Z79899 Other long term (current) drug therapy: Secondary | ICD-10-CM | POA: Insufficient documentation

## 2016-05-09 DIAGNOSIS — C50412 Malignant neoplasm of upper-outer quadrant of left female breast: Secondary | ICD-10-CM | POA: Diagnosis present

## 2016-05-09 DIAGNOSIS — Z9012 Acquired absence of left breast and nipple: Secondary | ICD-10-CM | POA: Diagnosis not present

## 2016-05-09 DIAGNOSIS — F419 Anxiety disorder, unspecified: Secondary | ICD-10-CM | POA: Diagnosis not present

## 2016-05-09 NOTE — Progress Notes (Signed)
Patient is here for follow up, she mentions some tenderness on the left breast, she does have some "phantom" pains.

## 2016-05-26 ENCOUNTER — Encounter: Payer: Self-pay | Admitting: Radiation Oncology

## 2016-05-26 ENCOUNTER — Ambulatory Visit
Admission: RE | Admit: 2016-05-26 | Discharge: 2016-05-26 | Disposition: A | Discharge status: Home / Self Care | Payer: BLUE CROSS/BLUE SHIELD | Source: Ambulatory Visit | Attending: Radiation Oncology | Admitting: Radiation Oncology

## 2016-05-26 VITALS — BP 133/80 | HR 71 | Temp 96.0°F | Resp 18 | Wt 179.3 lb

## 2016-05-26 DIAGNOSIS — Z170 Estrogen receptor positive status [ER+]: Secondary | ICD-10-CM | POA: Diagnosis not present

## 2016-05-26 DIAGNOSIS — Z9049 Acquired absence of other specified parts of digestive tract: Secondary | ICD-10-CM | POA: Insufficient documentation

## 2016-05-26 DIAGNOSIS — F419 Anxiety disorder, unspecified: Secondary | ICD-10-CM | POA: Diagnosis not present

## 2016-05-26 DIAGNOSIS — Z9012 Acquired absence of left breast and nipple: Secondary | ICD-10-CM | POA: Insufficient documentation

## 2016-05-26 DIAGNOSIS — Z8669 Personal history of other diseases of the nervous system and sense organs: Secondary | ICD-10-CM | POA: Insufficient documentation

## 2016-05-26 DIAGNOSIS — M129 Arthropathy, unspecified: Secondary | ICD-10-CM | POA: Insufficient documentation

## 2016-05-26 DIAGNOSIS — Z510 Encounter for antineoplastic radiation therapy: Secondary | ICD-10-CM | POA: Insufficient documentation

## 2016-05-26 DIAGNOSIS — K219 Gastro-esophageal reflux disease without esophagitis: Secondary | ICD-10-CM | POA: Diagnosis not present

## 2016-05-26 DIAGNOSIS — Z7982 Long term (current) use of aspirin: Secondary | ICD-10-CM | POA: Diagnosis not present

## 2016-05-26 DIAGNOSIS — Z79899 Other long term (current) drug therapy: Secondary | ICD-10-CM | POA: Diagnosis not present

## 2016-05-26 DIAGNOSIS — C50412 Malignant neoplasm of upper-outer quadrant of left female breast: Secondary | ICD-10-CM | POA: Insufficient documentation

## 2016-05-26 NOTE — Consult Note (Signed)
NEW PATIENT EVALUATION  Name: Jill Reilly  MRN: [id number]  Date:   05/26/2016     DOB: [birth year]   This 62 y.o. female patient presents to the clinic for initial evaluation of stage I a ER/PR positive HER-2/neu negative invasive mammary carcinoma of the left breast upper outer quadrant.  REFERRING PHYSICIAN: No ref. provider found  CHIEF COMPLAINT:  Chief Complaint  Patient presents with  . Breast Cancer    Pt is here for initial consultation for breast cancer.      DIAGNOSIS: The encounter diagnosis was Malignant neoplasm of upper-outer quadrant of left breast in female, estrogen receptor positive ([place]).   PREVIOUS INVESTIGATIONS:  Pathology reports reviewed Mammogram and ultrasound reviewed Clinical notes reviewed  HPI: Patient is a 61 year old female an abnormal mammogram of the left breast showing suspicious mass in the 2:00 position 4 cm from the nipple. This was confirmed on ultrasound to be a 1.1 cm lesion irregular and hypoechoic suspicious for malignancy. No abnormal lymph nodes were identified. She would not have a CT-guided biopsy showing invasive mammary carcinoma ER/PR positive HER-2/neu negative. She was not a wide local excision for a 1 cm overall grade 1 invasive mammary carcinoma margins clear at 8 mm. One sentinel lymph node was negative for metastatic disease. Patient has done well postoperatively. She had MammaPrint performed showing low risk for recurrence. She has been evaluated by medical oncology not thought to be a candidate for systemic chemotherapy based on size of tumor histology and MammaPrint results. She is seen today for radiation oncology opinion. She is doing well really no complaints postoperatively.   PLANNED TREATMENT REGIMEN: Whole breast radiation  PAST MEDICAL HISTORY:  has a past medical history of Anxiety; Arthritis; Cancer [facility]); GERD (gastroesophageal reflux disease); and Headache.    PAST SURGICAL HISTORY:  Past Surgical History:   Procedure Laterality Date  . BILATERAL CARPAL TUNNEL RELEASE    . BREAST BIOPSY Left 03/31/2016   path pending  . CESAREAN SECTION    . CHOLECYSTECTOMY    . PARTIAL MASTECTOMY WITH NEEDLE LOCALIZATION Left 04/29/2016   Procedure: PARTIAL MASTECTOMY WITH NEEDLE LOCALIZATION;  Surgeon: Leonie Green, MD;  Location: [facility] ORS;  Service: General;  Laterality: Left;  . SENTINEL NODE BIOPSY Left 04/29/2016   Procedure: SENTINEL NODE BIOPSY;  Surgeon: Leonie Green, MD;  Location: [facility] ORS;  Service: General;  Laterality: Left;    FAMILY HISTORY: family history is not on file.  SOCIAL HISTORY:  reports that she has never smoked. She has never used smokeless tobacco. She reports that she drinks alcohol. She reports that she does not use drugs.  ALLERGIES: Patient has no known allergies.  MEDICATIONS:  Current Outpatient Prescriptions  Medication Sig Dispense Refill  . acetaminophen (TYLENOL) 500 MG tablet Take 1,000 mg by mouth every 6 (six) hours as needed for moderate pain or headache.    Marland Kitchen aspirin EC 81 MG tablet Take 81 mg by mouth daily.    . Ca Carbonate-Mag Hydroxide (ROLAIDS PO) Take 1 tablet by mouth as needed.    Marland Kitchen ibuprofen (ADVIL,MOTRIN) 200 MG tablet Take 400 mg by mouth every 6 (six) hours as needed for headache or moderate pain.    . Probiotic Product (PHILLIPS COLON HEALTH PO) Take 1 capsule by mouth daily.     Marland Kitchen HYDROcodone-acetaminophen (NORCO) 5-325 MG tablet Take 1-2 tablets by mouth every 4 (four) hours as needed for moderate pain. (Patient not taking: Reported on 05/26/2016) 12 tablet 0  No current facility-administered medications for this encounter.     ECOG PERFORMANCE STATUS:  0 - Asymptomatic  REVIEW OF SYSTEMS:  Patient denies any weight loss, fatigue, weakness, fever, chills or night sweats. Patient denies any loss of vision, blurred vision. Patient denies any ringing  of the ears or hearing loss. No irregular heartbeat. Patient denies heart murmur or  history of fainting. Patient denies any chest pain or pain radiating to her upper extremities. Patient denies any shortness of breath, difficulty breathing at night, cough or hemoptysis. Patient denies any swelling in the lower legs. Patient denies any nausea vomiting, vomiting of blood, or coffee ground material in the vomitus. Patient denies any stomach pain. Patient states has had normal bowel movements no significant constipation or diarrhea. Patient denies any dysuria, hematuria or significant nocturia. Patient denies any problems walking, swelling in the joints or loss of balance. Patient denies any skin changes, loss of hair or loss of weight. Patient denies any excessive worrying or anxiety or significant depression. Patient denies any problems with insomnia. Patient denies excessive thirst, polyuria, polydipsia. Patient denies any swollen glands, patient denies easy bruising or easy bleeding. Patient denies any recent infections, allergies or URI. Patient "s visual fields have not changed significantly in recent time.    PHYSICAL EXAM: BP 133/80   Pulse 71   Temp (!) 96 F (35.6 C)   Resp 18   Wt 179 lb 5.5 oz (81.4 kg)   BMI 31.77 kg/m  Left breast is wide local excision scar which is healing well. No dominant mass or nodularity is noted in either breast in 2 positions examined. Breasts are large and pendulous. No axillary or supraclavicular adenopathy is identified. Well-developed well-nourished patient in NAD. HEENT reveals PERLA, EOMI, discs not visualized.  Oral cavity is clear. No oral mucosal lesions are identified. Neck is clear without evidence of cervical or supraclavicular adenopathy. Lungs are clear to A&P. Cardiac examination is essentially unremarkable with regular rate and rhythm without murmur rub or thrill. Abdomen is benign with no organomegaly or masses noted. Motor sensory and DTR levels are equal and symmetric in the upper and lower extremities. Cranial nerves II through  XII are grossly intact. Proprioception is intact. No peripheral adenopathy or edema is identified. No motor or sensory levels are noted. Crude visual fields are within normal range.  LABORATORY DATA: Pathology reports reviewed    RADIOLOGY RESULTS: Mammograms and ultrasound reviewed   IMPRESSION: Stage I invasive mammary carcinoma of the left breast in 61 year old female status post wide local excision ER/PR positive HER-2/neu negative for adjuvant whole breast radiation  PLAN: At this time I have recommended whole breast radiation to her left breast. Based on the size were breast do not think she is a candidate for hyperfractionated treatment. I would plan on delivering 5040 cGy in 28 fractions to her left breast. Would boost her scar another 1400 cGy using electron beam. Risks and benefits of treatment including skin reaction fatigue alteration of blood counts possible inclusion of superficial lung all were discussed in detail with the patient and her husband. Both seem to comprehend my treatment plan well. I first set up and ordered CT simulation for early next week. Patient also will be a candidate for antiestrogen therapy after completion of radiation.  I would like to take this opportunity to thank you for allowing me to participate in the care of your patient.Armstead Peaks., MD

## 2016-05-31 ENCOUNTER — Ambulatory Visit
Admission: RE | Admit: 2016-05-31 | Discharge: 2016-05-31 | Disposition: A | Discharge status: Home / Self Care | Payer: BLUE CROSS/BLUE SHIELD | Source: Ambulatory Visit | Attending: Radiation Oncology | Admitting: Radiation Oncology

## 2016-05-31 DIAGNOSIS — C50412 Malignant neoplasm of upper-outer quadrant of left female breast: Secondary | ICD-10-CM | POA: Diagnosis not present

## 2016-06-02 ENCOUNTER — Other Ambulatory Visit: Payer: Self-pay | Admitting: *Deleted

## 2016-06-02 DIAGNOSIS — C50412 Malignant neoplasm of upper-outer quadrant of left female breast: Secondary | ICD-10-CM

## 2016-06-06 DIAGNOSIS — C50412 Malignant neoplasm of upper-outer quadrant of left female breast: Secondary | ICD-10-CM | POA: Diagnosis not present

## 2016-06-09 ENCOUNTER — Ambulatory Visit: Payer: BLUE CROSS/BLUE SHIELD

## 2016-06-13 ENCOUNTER — Ambulatory Visit: Payer: BLUE CROSS/BLUE SHIELD

## 2016-06-14 ENCOUNTER — Ambulatory Visit
Admission: RE | Admit: 2016-06-14 | Discharge: 2016-06-14 | Disposition: A | Discharge status: Home / Self Care | Payer: BLUE CROSS/BLUE SHIELD | Source: Ambulatory Visit | Attending: Radiation Oncology | Admitting: Radiation Oncology

## 2016-06-14 ENCOUNTER — Ambulatory Visit: Payer: BLUE CROSS/BLUE SHIELD

## 2016-06-14 DIAGNOSIS — C50412 Malignant neoplasm of upper-outer quadrant of left female breast: Secondary | ICD-10-CM | POA: Diagnosis not present

## 2016-06-15 ENCOUNTER — Ambulatory Visit
Admission: RE | Admit: 2016-06-15 | Discharge: 2016-06-15 | Disposition: A | Discharge status: Home / Self Care | Payer: BLUE CROSS/BLUE SHIELD | Source: Ambulatory Visit | Attending: Radiation Oncology | Admitting: Radiation Oncology

## 2016-06-15 DIAGNOSIS — C50412 Malignant neoplasm of upper-outer quadrant of left female breast: Secondary | ICD-10-CM | POA: Diagnosis not present

## 2016-06-16 ENCOUNTER — Ambulatory Visit
Admission: RE | Admit: 2016-06-16 | Discharge: 2016-06-16 | Disposition: A | Discharge status: Home / Self Care | Payer: BLUE CROSS/BLUE SHIELD | Source: Ambulatory Visit | Attending: Radiation Oncology | Admitting: Radiation Oncology

## 2016-06-16 DIAGNOSIS — C50412 Malignant neoplasm of upper-outer quadrant of left female breast: Secondary | ICD-10-CM | POA: Diagnosis not present

## 2016-06-17 ENCOUNTER — Ambulatory Visit
Admission: RE | Admit: 2016-06-17 | Discharge: 2016-06-17 | Disposition: A | Discharge status: Home / Self Care | Payer: BLUE CROSS/BLUE SHIELD | Source: Ambulatory Visit | Attending: Radiation Oncology | Admitting: Radiation Oncology

## 2016-06-17 DIAGNOSIS — C50412 Malignant neoplasm of upper-outer quadrant of left female breast: Secondary | ICD-10-CM | POA: Diagnosis not present

## 2016-06-20 ENCOUNTER — Ambulatory Visit
Admission: RE | Admit: 2016-06-20 | Discharge: 2016-06-20 | Disposition: A | Discharge status: Home / Self Care | Payer: BLUE CROSS/BLUE SHIELD | Source: Ambulatory Visit | Attending: Radiation Oncology | Admitting: Radiation Oncology

## 2016-06-20 DIAGNOSIS — C50412 Malignant neoplasm of upper-outer quadrant of left female breast: Secondary | ICD-10-CM | POA: Diagnosis not present

## 2016-06-21 ENCOUNTER — Ambulatory Visit
Admission: RE | Admit: 2016-06-21 | Discharge: 2016-06-21 | Disposition: A | Discharge status: Home / Self Care | Payer: BLUE CROSS/BLUE SHIELD | Source: Ambulatory Visit | Attending: Radiation Oncology | Admitting: Radiation Oncology

## 2016-06-21 DIAGNOSIS — C50412 Malignant neoplasm of upper-outer quadrant of left female breast: Secondary | ICD-10-CM | POA: Diagnosis not present

## 2016-06-22 ENCOUNTER — Ambulatory Visit
Admission: RE | Admit: 2016-06-22 | Discharge: 2016-06-22 | Disposition: A | Discharge status: Home / Self Care | Payer: BLUE CROSS/BLUE SHIELD | Source: Ambulatory Visit | Attending: Radiation Oncology | Admitting: Radiation Oncology

## 2016-06-22 DIAGNOSIS — C50412 Malignant neoplasm of upper-outer quadrant of left female breast: Secondary | ICD-10-CM | POA: Diagnosis not present

## 2016-06-23 ENCOUNTER — Ambulatory Visit
Admission: RE | Admit: 2016-06-23 | Discharge: 2016-06-23 | Disposition: A | Discharge status: Home / Self Care | Payer: BLUE CROSS/BLUE SHIELD | Source: Ambulatory Visit | Attending: Radiation Oncology | Admitting: Radiation Oncology

## 2016-06-23 DIAGNOSIS — C50412 Malignant neoplasm of upper-outer quadrant of left female breast: Secondary | ICD-10-CM | POA: Diagnosis not present

## 2016-06-24 ENCOUNTER — Ambulatory Visit
Admission: RE | Admit: 2016-06-24 | Discharge: 2016-06-24 | Disposition: A | Discharge status: Home / Self Care | Payer: BLUE CROSS/BLUE SHIELD | Source: Ambulatory Visit | Attending: Radiation Oncology | Admitting: Radiation Oncology

## 2016-06-24 DIAGNOSIS — C50412 Malignant neoplasm of upper-outer quadrant of left female breast: Secondary | ICD-10-CM | POA: Diagnosis not present

## 2016-06-27 ENCOUNTER — Ambulatory Visit
Admission: RE | Admit: 2016-06-27 | Discharge: 2016-06-27 | Disposition: A | Discharge status: Home / Self Care | Payer: BLUE CROSS/BLUE SHIELD | Source: Ambulatory Visit | Attending: Radiation Oncology | Admitting: Radiation Oncology

## 2016-06-27 DIAGNOSIS — C50412 Malignant neoplasm of upper-outer quadrant of left female breast: Secondary | ICD-10-CM | POA: Diagnosis not present

## 2016-06-28 ENCOUNTER — Other Ambulatory Visit: Payer: Self-pay

## 2016-06-28 ENCOUNTER — Inpatient Hospital Stay: Payer: BLUE CROSS/BLUE SHIELD | Attending: Radiation Oncology

## 2016-06-28 ENCOUNTER — Ambulatory Visit
Admission: RE | Admit: 2016-06-28 | Discharge: 2016-06-28 | Disposition: A | Discharge status: Home / Self Care | Payer: BLUE CROSS/BLUE SHIELD | Source: Ambulatory Visit | Attending: Radiation Oncology | Admitting: Radiation Oncology

## 2016-06-28 DIAGNOSIS — Z9012 Acquired absence of left breast and nipple: Secondary | ICD-10-CM | POA: Insufficient documentation

## 2016-06-28 DIAGNOSIS — Z170 Estrogen receptor positive status [ER+]: Secondary | ICD-10-CM | POA: Diagnosis not present

## 2016-06-28 DIAGNOSIS — C50412 Malignant neoplasm of upper-outer quadrant of left female breast: Secondary | ICD-10-CM | POA: Diagnosis present

## 2016-06-28 LAB — CBC
HCT: 43.3 % (ref 35.0–47.0)
Hemoglobin: 15.1 g/dL (ref 12.0–16.0)
MCH: 30.7 pg (ref 26.0–34.0)
MCHC: 34.9 g/dL (ref 32.0–36.0)
MCV: 88 fL (ref 80.0–100.0)
Platelets: 261 10*3/uL (ref 150–440)
RBC: 4.91 MIL/uL (ref 3.80–5.20)
RDW: 12.7 % (ref 11.5–14.5)
WBC: 4.8 10*3/uL (ref 3.6–11.0)

## 2016-06-29 ENCOUNTER — Ambulatory Visit
Admission: RE | Admit: 2016-06-29 | Discharge: 2016-06-29 | Disposition: A | Discharge status: Home / Self Care | Payer: BLUE CROSS/BLUE SHIELD | Source: Ambulatory Visit | Attending: Radiation Oncology | Admitting: Radiation Oncology

## 2016-06-29 DIAGNOSIS — C50412 Malignant neoplasm of upper-outer quadrant of left female breast: Secondary | ICD-10-CM | POA: Diagnosis not present

## 2016-06-30 ENCOUNTER — Ambulatory Visit
Admission: RE | Admit: 2016-06-30 | Discharge: 2016-06-30 | Disposition: A | Discharge status: Home / Self Care | Payer: BLUE CROSS/BLUE SHIELD | Source: Ambulatory Visit | Attending: Radiation Oncology | Admitting: Radiation Oncology

## 2016-06-30 DIAGNOSIS — C50412 Malignant neoplasm of upper-outer quadrant of left female breast: Secondary | ICD-10-CM | POA: Diagnosis not present

## 2016-07-01 ENCOUNTER — Ambulatory Visit
Admission: RE | Admit: 2016-07-01 | Discharge: 2016-07-01 | Disposition: A | Discharge status: Home / Self Care | Payer: BLUE CROSS/BLUE SHIELD | Source: Ambulatory Visit | Attending: Radiation Oncology | Admitting: Radiation Oncology

## 2016-07-01 DIAGNOSIS — C50412 Malignant neoplasm of upper-outer quadrant of left female breast: Secondary | ICD-10-CM | POA: Diagnosis not present

## 2016-07-04 ENCOUNTER — Ambulatory Visit
Admission: RE | Admit: 2016-07-04 | Discharge: 2016-07-04 | Disposition: A | Discharge status: Home / Self Care | Payer: BLUE CROSS/BLUE SHIELD | Source: Ambulatory Visit | Attending: Radiation Oncology | Admitting: Radiation Oncology

## 2016-07-04 DIAGNOSIS — C50412 Malignant neoplasm of upper-outer quadrant of left female breast: Secondary | ICD-10-CM | POA: Diagnosis not present

## 2016-07-05 ENCOUNTER — Ambulatory Visit
Admission: RE | Admit: 2016-07-05 | Discharge: 2016-07-05 | Disposition: A | Discharge status: Home / Self Care | Payer: BLUE CROSS/BLUE SHIELD | Source: Ambulatory Visit | Attending: Radiation Oncology | Admitting: Radiation Oncology

## 2016-07-05 DIAGNOSIS — C50412 Malignant neoplasm of upper-outer quadrant of left female breast: Secondary | ICD-10-CM | POA: Diagnosis not present

## 2016-07-06 ENCOUNTER — Ambulatory Visit
Admission: RE | Admit: 2016-07-06 | Discharge: 2016-07-06 | Disposition: A | Discharge status: Home / Self Care | Payer: BLUE CROSS/BLUE SHIELD | Source: Ambulatory Visit | Attending: Radiation Oncology | Admitting: Radiation Oncology

## 2016-07-06 DIAGNOSIS — C50412 Malignant neoplasm of upper-outer quadrant of left female breast: Secondary | ICD-10-CM | POA: Diagnosis not present

## 2016-07-07 ENCOUNTER — Ambulatory Visit
Admission: RE | Admit: 2016-07-07 | Discharge: 2016-07-07 | Disposition: A | Discharge status: Home / Self Care | Payer: BLUE CROSS/BLUE SHIELD | Source: Ambulatory Visit | Attending: Radiation Oncology | Admitting: Radiation Oncology

## 2016-07-07 DIAGNOSIS — C50412 Malignant neoplasm of upper-outer quadrant of left female breast: Secondary | ICD-10-CM | POA: Diagnosis not present

## 2016-07-08 ENCOUNTER — Ambulatory Visit: Payer: BLUE CROSS/BLUE SHIELD

## 2016-07-08 ENCOUNTER — Ambulatory Visit
Admission: RE | Admit: 2016-07-08 | Discharge: 2016-07-08 | Disposition: A | Discharge status: Home / Self Care | Payer: BLUE CROSS/BLUE SHIELD | Source: Ambulatory Visit | Attending: Radiation Oncology | Admitting: Radiation Oncology

## 2016-07-08 DIAGNOSIS — C50412 Malignant neoplasm of upper-outer quadrant of left female breast: Secondary | ICD-10-CM | POA: Diagnosis not present

## 2016-07-11 ENCOUNTER — Ambulatory Visit
Admission: RE | Admit: 2016-07-11 | Discharge: 2016-07-11 | Disposition: A | Discharge status: Home / Self Care | Payer: BLUE CROSS/BLUE SHIELD | Source: Ambulatory Visit | Attending: Radiation Oncology | Admitting: Radiation Oncology

## 2016-07-11 DIAGNOSIS — C50412 Malignant neoplasm of upper-outer quadrant of left female breast: Secondary | ICD-10-CM | POA: Diagnosis not present

## 2016-07-12 ENCOUNTER — Inpatient Hospital Stay: Payer: BLUE CROSS/BLUE SHIELD

## 2016-07-12 ENCOUNTER — Ambulatory Visit
Admission: RE | Admit: 2016-07-12 | Discharge: 2016-07-12 | Disposition: A | Discharge status: Home / Self Care | Payer: BLUE CROSS/BLUE SHIELD | Source: Ambulatory Visit | Attending: Radiation Oncology | Admitting: Radiation Oncology

## 2016-07-12 DIAGNOSIS — C50412 Malignant neoplasm of upper-outer quadrant of left female breast: Secondary | ICD-10-CM

## 2016-07-12 LAB — CBC
HEMATOCRIT: 43.9 % (ref 35.0–47.0)
Hemoglobin: 15.2 g/dL (ref 12.0–16.0)
MCH: 30.8 pg (ref 26.0–34.0)
MCHC: 34.6 g/dL (ref 32.0–36.0)
MCV: 89.1 fL (ref 80.0–100.0)
Platelets: 239 10*3/uL (ref 150–440)
RBC: 4.93 MIL/uL (ref 3.80–5.20)
RDW: 13 % (ref 11.5–14.5)
WBC: 5.2 10*3/uL (ref 3.6–11.0)

## 2016-07-13 ENCOUNTER — Ambulatory Visit
Admission: RE | Admit: 2016-07-13 | Discharge: 2016-07-13 | Disposition: A | Discharge status: Home / Self Care | Payer: BLUE CROSS/BLUE SHIELD | Source: Ambulatory Visit | Attending: Radiation Oncology | Admitting: Radiation Oncology

## 2016-07-13 DIAGNOSIS — C50412 Malignant neoplasm of upper-outer quadrant of left female breast: Secondary | ICD-10-CM | POA: Diagnosis not present

## 2016-07-14 ENCOUNTER — Ambulatory Visit
Admission: RE | Admit: 2016-07-14 | Discharge: 2016-07-14 | Disposition: A | Discharge status: Home / Self Care | Payer: BLUE CROSS/BLUE SHIELD | Source: Ambulatory Visit | Attending: Radiation Oncology | Admitting: Radiation Oncology

## 2016-07-14 DIAGNOSIS — C50412 Malignant neoplasm of upper-outer quadrant of left female breast: Secondary | ICD-10-CM | POA: Diagnosis not present

## 2016-07-15 ENCOUNTER — Ambulatory Visit
Admission: RE | Admit: 2016-07-15 | Discharge: 2016-07-15 | Disposition: A | Discharge status: Home / Self Care | Payer: BLUE CROSS/BLUE SHIELD | Source: Ambulatory Visit | Attending: Radiation Oncology | Admitting: Radiation Oncology

## 2016-07-15 DIAGNOSIS — C50412 Malignant neoplasm of upper-outer quadrant of left female breast: Secondary | ICD-10-CM | POA: Diagnosis not present

## 2016-07-18 ENCOUNTER — Ambulatory Visit
Admission: RE | Admit: 2016-07-18 | Discharge: 2016-07-18 | Disposition: A | Discharge status: Home / Self Care | Payer: BLUE CROSS/BLUE SHIELD | Source: Ambulatory Visit | Attending: Radiation Oncology | Admitting: Radiation Oncology

## 2016-07-18 DIAGNOSIS — C50412 Malignant neoplasm of upper-outer quadrant of left female breast: Secondary | ICD-10-CM | POA: Diagnosis not present

## 2016-07-19 ENCOUNTER — Ambulatory Visit
Admission: RE | Admit: 2016-07-19 | Discharge: 2016-07-19 | Disposition: A | Discharge status: Home / Self Care | Payer: BLUE CROSS/BLUE SHIELD | Source: Ambulatory Visit | Attending: Radiation Oncology | Admitting: Radiation Oncology

## 2016-07-19 DIAGNOSIS — C50412 Malignant neoplasm of upper-outer quadrant of left female breast: Secondary | ICD-10-CM | POA: Diagnosis not present

## 2016-07-20 ENCOUNTER — Ambulatory Visit
Admission: RE | Admit: 2016-07-20 | Discharge: 2016-07-20 | Disposition: A | Discharge status: Home / Self Care | Payer: BLUE CROSS/BLUE SHIELD | Source: Ambulatory Visit | Attending: Radiation Oncology | Admitting: Radiation Oncology

## 2016-07-20 DIAGNOSIS — C50412 Malignant neoplasm of upper-outer quadrant of left female breast: Secondary | ICD-10-CM | POA: Diagnosis not present

## 2016-07-21 ENCOUNTER — Ambulatory Visit
Admission: RE | Admit: 2016-07-21 | Discharge: 2016-07-21 | Disposition: A | Discharge status: Home / Self Care | Payer: BLUE CROSS/BLUE SHIELD | Source: Ambulatory Visit | Attending: Radiation Oncology | Admitting: Radiation Oncology

## 2016-07-21 DIAGNOSIS — C50412 Malignant neoplasm of upper-outer quadrant of left female breast: Secondary | ICD-10-CM | POA: Diagnosis not present

## 2016-07-22 ENCOUNTER — Ambulatory Visit
Admission: RE | Admit: 2016-07-22 | Discharge: 2016-07-22 | Disposition: A | Discharge status: Home / Self Care | Payer: BLUE CROSS/BLUE SHIELD | Source: Ambulatory Visit | Attending: Radiation Oncology | Admitting: Radiation Oncology

## 2016-07-22 DIAGNOSIS — C50412 Malignant neoplasm of upper-outer quadrant of left female breast: Secondary | ICD-10-CM | POA: Diagnosis not present

## 2016-07-25 ENCOUNTER — Other Ambulatory Visit: Payer: Self-pay | Admitting: *Deleted

## 2016-07-25 ENCOUNTER — Ambulatory Visit
Admission: RE | Admit: 2016-07-25 | Discharge: 2016-07-25 | Disposition: A | Discharge status: Home / Self Care | Payer: BLUE CROSS/BLUE SHIELD | Source: Ambulatory Visit | Attending: Radiation Oncology | Admitting: Radiation Oncology

## 2016-07-25 DIAGNOSIS — C50412 Malignant neoplasm of upper-outer quadrant of left female breast: Secondary | ICD-10-CM | POA: Diagnosis not present

## 2016-07-25 MED ORDER — SILVER SULFADIAZINE 1 % EX CREA: 1.0000 "application " | TOPICAL_CREAM | Freq: Two times a day (BID) | CUTANEOUS | 3 refills | Status: DC

## 2016-07-26 ENCOUNTER — Inpatient Hospital Stay: Payer: BLUE CROSS/BLUE SHIELD | Attending: Radiation Oncology

## 2016-07-26 ENCOUNTER — Other Ambulatory Visit: Payer: Self-pay

## 2016-07-26 ENCOUNTER — Ambulatory Visit
Admission: RE | Admit: 2016-07-26 | Discharge: 2016-07-26 | Disposition: A | Discharge status: Home / Self Care | Payer: BLUE CROSS/BLUE SHIELD | Source: Ambulatory Visit | Attending: Radiation Oncology | Admitting: Radiation Oncology

## 2016-07-26 DIAGNOSIS — Z7982 Long term (current) use of aspirin: Secondary | ICD-10-CM | POA: Insufficient documentation

## 2016-07-26 DIAGNOSIS — Z923 Personal history of irradiation: Secondary | ICD-10-CM | POA: Insufficient documentation

## 2016-07-26 DIAGNOSIS — K219 Gastro-esophageal reflux disease without esophagitis: Secondary | ICD-10-CM | POA: Insufficient documentation

## 2016-07-26 DIAGNOSIS — Z79811 Long term (current) use of aromatase inhibitors: Secondary | ICD-10-CM | POA: Diagnosis not present

## 2016-07-26 DIAGNOSIS — Z170 Estrogen receptor positive status [ER+]: Secondary | ICD-10-CM | POA: Diagnosis not present

## 2016-07-26 DIAGNOSIS — F419 Anxiety disorder, unspecified: Secondary | ICD-10-CM | POA: Insufficient documentation

## 2016-07-26 DIAGNOSIS — C50412 Malignant neoplasm of upper-outer quadrant of left female breast: Secondary | ICD-10-CM | POA: Diagnosis not present

## 2016-07-26 LAB — CBC
HEMATOCRIT: 43.1 % (ref 35.0–47.0)
HEMOGLOBIN: 15.1 g/dL (ref 12.0–16.0)
MCH: 30.7 pg (ref 26.0–34.0)
MCHC: 35.1 g/dL (ref 32.0–36.0)
MCV: 87.6 fL (ref 80.0–100.0)
Platelets: 233 10*3/uL (ref 150–440)
RBC: 4.92 MIL/uL (ref 3.80–5.20)
RDW: 12.8 % (ref 11.5–14.5)
WBC: 4.5 10*3/uL (ref 3.6–11.0)

## 2016-07-27 ENCOUNTER — Ambulatory Visit
Admission: RE | Admit: 2016-07-27 | Discharge: 2016-07-27 | Disposition: A | Discharge status: Home / Self Care | Payer: BLUE CROSS/BLUE SHIELD | Source: Ambulatory Visit | Attending: Radiation Oncology | Admitting: Radiation Oncology

## 2016-07-27 DIAGNOSIS — C50412 Malignant neoplasm of upper-outer quadrant of left female breast: Secondary | ICD-10-CM | POA: Diagnosis not present

## 2016-07-28 ENCOUNTER — Ambulatory Visit
Admission: RE | Admit: 2016-07-28 | Discharge: 2016-07-28 | Disposition: A | Discharge status: Home / Self Care | Payer: BLUE CROSS/BLUE SHIELD | Source: Ambulatory Visit | Attending: Radiation Oncology | Admitting: Radiation Oncology

## 2016-07-28 DIAGNOSIS — C50412 Malignant neoplasm of upper-outer quadrant of left female breast: Secondary | ICD-10-CM | POA: Diagnosis not present

## 2016-07-29 ENCOUNTER — Ambulatory Visit
Admission: RE | Admit: 2016-07-29 | Discharge: 2016-07-29 | Disposition: A | Discharge status: Home / Self Care | Payer: BLUE CROSS/BLUE SHIELD | Source: Ambulatory Visit | Attending: Radiation Oncology | Admitting: Radiation Oncology

## 2016-07-29 DIAGNOSIS — C50412 Malignant neoplasm of upper-outer quadrant of left female breast: Secondary | ICD-10-CM | POA: Diagnosis not present

## 2016-08-01 ENCOUNTER — Ambulatory Visit
Admission: RE | Admit: 2016-08-01 | Discharge: 2016-08-01 | Disposition: A | Discharge status: Home / Self Care | Payer: BLUE CROSS/BLUE SHIELD | Source: Ambulatory Visit | Attending: Radiation Oncology | Admitting: Radiation Oncology

## 2016-08-01 DIAGNOSIS — C50412 Malignant neoplasm of upper-outer quadrant of left female breast: Secondary | ICD-10-CM | POA: Diagnosis not present

## 2016-08-02 ENCOUNTER — Ambulatory Visit
Admission: RE | Admit: 2016-08-02 | Discharge: 2016-08-02 | Disposition: A | Discharge status: Home / Self Care | Payer: BLUE CROSS/BLUE SHIELD | Source: Ambulatory Visit | Attending: Radiation Oncology | Admitting: Radiation Oncology

## 2016-08-02 DIAGNOSIS — C50412 Malignant neoplasm of upper-outer quadrant of left female breast: Secondary | ICD-10-CM | POA: Diagnosis not present

## 2016-08-03 ENCOUNTER — Ambulatory Visit
Admission: RE | Admit: 2016-08-03 | Discharge: 2016-08-03 | Disposition: A | Discharge status: Home / Self Care | Payer: BLUE CROSS/BLUE SHIELD | Source: Ambulatory Visit | Attending: Radiation Oncology | Admitting: Radiation Oncology

## 2016-08-03 DIAGNOSIS — C50412 Malignant neoplasm of upper-outer quadrant of left female breast: Secondary | ICD-10-CM | POA: Diagnosis not present

## 2016-08-16 NOTE — Progress Notes (Signed)
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 Low risk Mammoprint.  INTERVAL HISTORY: Patient returns to clinic today for further evaluation and initiation of an aromatase inhibitor. She recently completed her adjuvant XRT and tolerated it well with only some mild skin breakdown which is improving. She currently feels well. She has no neurologic complaints. She has a good appetite and denies any weight loss. She has no recent fevers or illnesses. She denies any chest pain or shortness of breath. She denies any nausea, vomiting, constipation, or diarrhea. She has no urinary complaints. Patient offers no further specific complaints today.  REVIEW OF SYSTEMS:   Review of Systems  Constitutional: Negative.  Negative for fever, malaise/fatigue and weight loss.  Respiratory: Negative.  Negative for cough.   Cardiovascular: Negative.  Negative for chest pain and leg swelling.  Gastrointestinal: Negative.  Negative for abdominal pain.  Genitourinary: Negative.   Musculoskeletal: Negative.   Neurological: Negative.  Negative for weakness.  Psychiatric/Behavioral: Negative.  The patient is not nervous/anxious.     As per HPI. Otherwise, a complete review of systems is negative.  PAST MEDICAL HISTORY: Past Medical History:  Diagnosis Date  . Anxiety   . Arthritis    FINGERS  . Cancer (HCC)    BREAST  . GERD (gastroesophageal reflux disease)   . Headache    H/O MIGRAINES    PAST SURGICAL HISTORY: Past Surgical History:  Procedure Laterality Date  . BILATERAL CARPAL TUNNEL RELEASE    . BREAST BIOPSY Left 03/31/2016   path pending  . CESAREAN SECTION    . CHOLECYSTECTOMY    .  PARTIAL MASTECTOMY WITH NEEDLE LOCALIZATION Left 04/29/2016   Procedure: PARTIAL MASTECTOMY WITH NEEDLE LOCALIZATION;  Surgeon: Leonie Green, MD;  Location: [facility] ORS;  Service: General;  Laterality: Left;  . SENTINEL NODE BIOPSY Left 04/29/2016   Procedure: SENTINEL NODE BIOPSY;  Surgeon: Leonie Green, MD;  Location: [facility] ORS;  Service: General;  Laterality: Left;    FAMILY HISTORY: Family History  Problem Relation Age of Onset  . Breast cancer Neg Hx     ADVANCED DIRECTIVES (Y/N):  N  HEALTH MAINTENANCE: Social History  Substance Use Topics  . Smoking status: Never Smoker  . Smokeless tobacco: Never Used  . Alcohol use Yes     Comment: occasional     Colonoscopy:  PAP:  Bone density:  Lipid panel:  No Known Allergies  Current Outpatient Prescriptions  Medication Sig Dispense Refill  . acetaminophen (TYLENOL) 500 MG tablet Take 1,000 mg by mouth every 6 (six) hours as needed for moderate pain or headache.    Marland Kitchen aspirin EC 81 MG tablet Take 81 mg by mouth daily.    . Ca Carbonate-Mag Hydroxide (ROLAIDS PO) Take 1 tablet by mouth as needed.    Marland Kitchen ibuprofen (ADVIL,MOTRIN) 200 MG tablet Take 400 mg by mouth every 6 (six) hours as needed for headache or moderate pain.    Marland Kitchen letrozole (FEMARA) 2.5 MG tablet Take 1 tablet (2.5 mg total) by mouth daily. 90 tablet 3  . Multiple Vitamin (MULTI-VITAMINS) TABS Take by mouth.    . Probiotic Product (PHILLIPS COLON HEALTH PO) Take 1 capsule by mouth daily.     Marland Kitchen  silver sulfADIAZINE (SILVADENE) 1 % cream Apply 1 application topically 2 (two) times daily. 50 g 3   No current facility-administered medications for this visit.     OBJECTIVE: Vitals:   08/18/16 1121  BP: 136/83  Pulse: 64  Temp: 97.9 F (36.6 C)     Body mass index is 30.65 kg/m.    ECOG FS:0 - Asymptomatic  General: Well-developed, well-nourished, no acute distress. Eyes: Pink conjunctiva, anicteric sclera. Breasts: Patient requested exam be deferred  today. Lungs: Clear to auscultation bilaterally. Heart: Regular rate and rhythm. No rubs, murmurs, or gallops. Abdomen: Soft, nontender, nondistended. No organomegaly noted, normoactive bowel sounds. Musculoskeletal: No edema, cyanosis, or clubbing. Neuro: Alert, answering all questions appropriately. Cranial nerves grossly intact. Skin: No rashes or petechiae noted. Psych: Normal affect.   LAB RESULTS:  Lab Results  Component Value Date   K 3.9 04/25/2016    Lab Results  Component Value Date   WBC 4.5 07/26/2016   HGB 15.1 07/26/2016   HCT 43.1 07/26/2016   MCV 87.6 07/26/2016   PLT 233 07/26/2016     STUDIES: No results found.  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 Low risk Mammoprint.  PLAN:    1. 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 Low risk Mammoprint: Patient recently completed adjuvant XRT. She did not require chemotherapy given her low risk Mammoprint. Patient was given a prescription for letrozole today which she will take for 5 years completing in April 2023. We will get a baseline bone mineral density in the next 1-2 weeks. Return to clinic in 3 months with repeat laboratory work and further evaluation.   Approximately 20 minutes was spent in discussion of which greater than 50% was consultation.  Patient expressed understanding and was in agreement with this plan. She also understands that She can call clinic at any time with any questions, concerns, or complaints.   Cancer Staging Primary cancer of upper outer quadrant of left female breast [facility]) Staging form: Breast, AJCC 7th Edition - Pathologic stage from 05/09/2016: Stage IA (T1c, N0, cM0) - Signed by Lloyd Huger, MD on 05/09/2016    Lloyd Huger, MD 08/19/16 2:01 PM

## 2016-08-18 ENCOUNTER — Encounter: Payer: Self-pay | Admitting: Oncology

## 2016-08-18 ENCOUNTER — Inpatient Hospital Stay (HOSPITAL_BASED_OUTPATIENT_CLINIC_OR_DEPARTMENT_OTHER): Payer: BLUE CROSS/BLUE SHIELD | Admitting: Oncology

## 2016-08-18 VITALS — BP 136/83 | HR 64 | Temp 97.9°F | Wt 173.0 lb

## 2016-08-18 DIAGNOSIS — Z923 Personal history of irradiation: Secondary | ICD-10-CM | POA: Diagnosis not present

## 2016-08-18 DIAGNOSIS — Z170 Estrogen receptor positive status [ER+]: Secondary | ICD-10-CM | POA: Diagnosis not present

## 2016-08-18 DIAGNOSIS — Z79811 Long term (current) use of aromatase inhibitors: Secondary | ICD-10-CM | POA: Diagnosis not present

## 2016-08-18 DIAGNOSIS — C50412 Malignant neoplasm of upper-outer quadrant of left female breast: Secondary | ICD-10-CM | POA: Diagnosis not present

## 2016-08-18 MED ORDER — LETROZOLE 2.5 MG PO TABS: 2.5000 mg | ORAL_TABLET | Freq: Every day | ORAL | 0 refills | Status: DC

## 2016-08-18 MED ORDER — LETROZOLE 2.5 MG PO TABS: 2.5000 mg | ORAL_TABLET | Freq: Every day | ORAL | 3 refills | Status: DC

## 2016-08-19 ENCOUNTER — Encounter: Payer: Self-pay | Admitting: *Deleted

## 2016-08-30 ENCOUNTER — Ambulatory Visit
Admission: RE | Admit: 2016-08-30 | Discharge: 2016-08-30 | Disposition: A | Discharge status: Home / Self Care | Payer: BLUE CROSS/BLUE SHIELD | Source: Ambulatory Visit | Attending: Oncology | Admitting: Oncology

## 2016-08-30 DIAGNOSIS — M85851 Other specified disorders of bone density and structure, right thigh: Secondary | ICD-10-CM | POA: Diagnosis not present

## 2016-08-30 DIAGNOSIS — C50412 Malignant neoplasm of upper-outer quadrant of left female breast: Secondary | ICD-10-CM | POA: Diagnosis not present

## 2016-08-30 DIAGNOSIS — Z79899 Other long term (current) drug therapy: Secondary | ICD-10-CM | POA: Diagnosis present

## 2016-09-05 ENCOUNTER — Ambulatory Visit
Admission: RE | Admit: 2016-09-05 | Discharge: 2016-09-05 | Disposition: A | Discharge status: Home / Self Care | Payer: BLUE CROSS/BLUE SHIELD | Source: Ambulatory Visit | Attending: Radiation Oncology | Admitting: Radiation Oncology

## 2016-09-05 ENCOUNTER — Encounter: Payer: Self-pay | Admitting: Radiation Oncology

## 2016-09-05 VITALS — BP 166/91 | HR 61 | Temp 95.7°F | Resp 18 | Wt 177.4 lb

## 2016-09-05 DIAGNOSIS — Z923 Personal history of irradiation: Secondary | ICD-10-CM | POA: Insufficient documentation

## 2016-09-05 DIAGNOSIS — C50412 Malignant neoplasm of upper-outer quadrant of left female breast: Secondary | ICD-10-CM | POA: Diagnosis present

## 2016-09-05 DIAGNOSIS — Z79811 Long term (current) use of aromatase inhibitors: Secondary | ICD-10-CM | POA: Diagnosis not present

## 2016-09-05 DIAGNOSIS — Z170 Estrogen receptor positive status [ER+]: Secondary | ICD-10-CM | POA: Diagnosis not present

## 2016-09-05 NOTE — Progress Notes (Signed)
Radiation Oncology Follow up Note  Name: Jill Reilly   Date:   09/05/2016 MRN:  [id number] DOB: [birth year]    This 61 y.o. 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  REFERRING PROVIDER: No ref. 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 She is seen today in the routine follow-up and is doing well..she's been started on Femara tolerate that well without side effect.  COMPLICATIONS OF TREATMENT: none  FOLLOW UP COMPLIANCE: keeps appointments   PHYSICAL EXAM:  BP (!) 166/91   Pulse 61   Temp (!) 95.7 F (35.4 C)   Resp 18   Wt 177 lb 5.8 oz (80.4 kg)   BMI 31.42 kg/m   Lungs are clear to A&P cardiac examination essentially unremarkable with regular rate and rhythm. No dominant mass or nodularity is noted in either breast in 2 positions examined. Incision is well-healed. No axillary or supraclavicular adenopathy is appreciated. Cosmetic result is excellent. Well-developed well-nourished patient in NAD. HEENT reveals PERLA, EOMI, discs not visualized.  Oral cavity is clear. No oral mucosal lesions are identified. Neck is clear without evidence of cervical or supraclavicular adenopathy. Lungs are clear to A&P. Cardiac examination is essentially unremarkable with regular rate and rhythm without murmur rub or thrill. Abdomen is benign with no organomegaly or masses noted. Motor sensory and DTR levels are equal and symmetric in the upper and lower extremities. Cranial nerves II through XII are grossly intact. Proprioception is intact. No peripheral adenopathy or edema is identified. No motor or sensory levels are noted. Crude visual fields are within normal  range.  RADIOLOGY RESULTS: no current films for review  PLAN: at the present time patient is doing well 1 month out. She's achieved an excellent cosmetic result. No sniffing problems. She's currently on Femara without side effect. I've asked to see her back in 4-5 months for follow-up. She knows to call sooner with any concerns.  I would like to take this opportunity to thank you for allowing me to participate in the care of your patient.Armstead Peaks., MD

## 2016-09-08 ENCOUNTER — Ambulatory Visit: Payer: BLUE CROSS/BLUE SHIELD | Admitting: Radiation Oncology

## 2016-09-13 ENCOUNTER — Ambulatory Visit: Payer: BLUE CROSS/BLUE SHIELD | Admitting: Radiation Oncology

## 2016-11-16 NOTE — Progress Notes (Signed)
[place]  [phone] [phone]  ID: Jill Reilly OB: 20-Jan-1956  MR#: [id number]  FFM#:[id number]  Patient Care Team: System, Pcp Not In as PCP - General  CHIEF COMPLAINT: Pathologic stage Ia ER/PR positive, HER-2 negative invasive carcinoma of the upper outer quadrant of the left breast. Low risk Mammoprint.  INTERVAL HISTORY: Patient returns to clinic today for further evaluation. She recently started letrozole and complains of mild hot flashes only. She completed her adjuvant XRT and tolerated it well with only some mild skin breakdown which is improved. She currently feels well. She recently broke her right ankle and is currently wearing a brace. She returns to ortho today to be evaluated. She has no neurologic complaints. She has a good appetite and denies any weight loss. She has no recent fevers or illnesses. She denies any chest pain or shortness of breath. She denies any nausea, vomiting, constipation, or diarrhea. She has no urinary complaints. Patient offers no further specific complaints today.  REVIEW OF SYSTEMS:   Review of Systems  Constitutional: Negative.  Negative for fever, malaise/fatigue and weight loss.  Respiratory: Negative.  Negative for cough.   Cardiovascular: Negative.  Negative for chest pain and leg swelling.  Gastrointestinal: Negative.  Negative for abdominal pain.  Genitourinary: Negative.   Musculoskeletal: Positive for falls.       Broke right ankle. Seeing ortho.   Neurological: Negative.  Negative for weakness.  Psychiatric/Behavioral: Negative.  The patient is not nervous/anxious.     As per HPI. Otherwise, a complete review of systems is negative.  PAST MEDICAL HISTORY: Past Medical History:  Diagnosis Date  . Anxiety   . Arthritis    FINGERS  . Cancer (HCC)    BREAST  . GERD (gastroesophageal reflux disease)   . Headache    H/O MIGRAINES    PAST SURGICAL HISTORY: Past Surgical History:    Procedure Laterality Date  . BILATERAL CARPAL TUNNEL RELEASE    . BREAST BIOPSY Left 03/31/2016   path pending  . CESAREAN SECTION    . CHOLECYSTECTOMY    . PARTIAL MASTECTOMY WITH NEEDLE LOCALIZATION Left 04/29/2016   Procedure: PARTIAL MASTECTOMY WITH NEEDLE LOCALIZATION;  Surgeon: Leonie Green, MD;  Location: [facility] ORS;  Service: General;  Laterality: Left;  . SENTINEL NODE BIOPSY Left 04/29/2016   Procedure: SENTINEL NODE BIOPSY;  Surgeon: Leonie Green, MD;  Location: [facility] ORS;  Service: General;  Laterality: Left;    FAMILY HISTORY: Family History  Problem Relation Age of Onset  . Breast cancer Neg Hx     ADVANCED DIRECTIVES (Y/N):  N  HEALTH MAINTENANCE: Social History  Substance Use Topics  . Smoking status: Never Smoker  . Smokeless tobacco: Never Used  . Alcohol use Yes     Comment: occasional     Colonoscopy:  PAP:  Bone density:  Lipid panel:  No Known Allergies  Current Outpatient Prescriptions  Medication Sig Dispense Refill  . acetaminophen (TYLENOL) 500 MG tablet Take 1,000 mg by mouth every 6 (six) hours as needed for moderate pain or headache.    Marland Kitchen aspirin EC 81 MG tablet Take 81 mg by mouth daily.    . Ca Carbonate-Mag Hydroxide (ROLAIDS PO) Take 1 tablet by mouth as needed.    Marland Kitchen ibuprofen (ADVIL,MOTRIN) 200 MG tablet Take 400 mg by mouth every 6 (six) hours as needed for headache or moderate pain.    Marland Kitchen letrozole (FEMARA) 2.5 MG tablet Take 1 tablet (2.5 mg  total) by mouth daily. 90 tablet 3  . Multiple Vitamin (MULTI-VITAMINS) TABS Take by mouth.    . Probiotic Product (PHILLIPS COLON HEALTH PO) Take 1 capsule by mouth daily.     . silver sulfADIAZINE (SILVADENE) 1 % cream Apply 1 application topically 2 (two) times daily. 50 g 3   No current facility-administered medications for this visit.     OBJECTIVE: Vitals:   11/17/16 1006  BP: 136/62  Pulse: 61  Resp: 20  Temp: (!) 96.8 F (36 C)     Body mass index is 30.98 kg/m.     ECOG FS:0 - Asymptomatic  General: Well-developed, well-nourished, no acute distress. Eyes: Pink conjunctiva, anicteric sclera. Breasts: Patient requested exam be deferred today. Lungs: Clear to auscultation bilaterally. Heart: Regular rate and rhythm. No rubs, murmurs, or gallops. Abdomen: Soft, nontender, nondistended. No organomegaly noted, normoactive bowel sounds. Musculoskeletal: Right ankle and foot swollen from break No cyanosis, or clubbing. Neuro: Alert, answering all questions appropriately. Cranial nerves grossly intact. Skin: No rashes or petechiae noted. Psych: Normal affect.   LAB RESULTS:  Lab Results  Component Value Date   K 3.9 04/25/2016    Lab Results  Component Value Date   WBC 4.5 07/26/2016   HGB 15.1 07/26/2016   HCT 43.1 07/26/2016   MCV 87.6 07/26/2016   PLT 233 07/26/2016     STUDIES: No results found.  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 Low risk Mammoprint.  PLAN:    1. 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 Low risk Mammoprint: Patient recently completed adjuvant XRT. She did not require chemotherapy given her low risk Mammoprint. Currently taking letrozole which she will take for 5 years completing in April 2023. Baseline bone density indicated a T score of -1.2. Recomeneded OTC calcium and vitamin D. Return to clinic in 6 months with further evaluation.   Approximately 20 minutes was spent in discussion of which greater than 50% was consultation.  Patient expressed understanding and was in agreement with this plan. She also understands that She can call clinic at any time with any questions, concerns, or complaints.   Cancer Staging Primary cancer of upper outer quadrant of left female breast [facility]) Staging form: Breast, AJCC 7th Edition - Pathologic stage from  05/09/2016: Stage IA (T1c, N0, cM0) - Signed by Lloyd Huger, MD on 05/09/2016   Faythe Casa, NP  Patient was seen and evaluated independently and I agree with the assessment and plan as dictated above. Patient's next mammogram will be scheduled for November 2018.  Lloyd Huger, MD 11/20/16 8:02 AM

## 2016-11-17 ENCOUNTER — Inpatient Hospital Stay: Payer: BLUE CROSS/BLUE SHIELD | Attending: Oncology | Admitting: Oncology

## 2016-11-17 VITALS — BP 136/62 | HR 61 | Temp 96.8°F | Resp 20 | Wt 174.9 lb

## 2016-11-17 DIAGNOSIS — Z170 Estrogen receptor positive status [ER+]: Secondary | ICD-10-CM | POA: Diagnosis not present

## 2016-11-17 DIAGNOSIS — Z7982 Long term (current) use of aspirin: Secondary | ICD-10-CM | POA: Insufficient documentation

## 2016-11-17 DIAGNOSIS — Z923 Personal history of irradiation: Secondary | ICD-10-CM | POA: Diagnosis not present

## 2016-11-17 DIAGNOSIS — Z79811 Long term (current) use of aromatase inhibitors: Secondary | ICD-10-CM | POA: Insufficient documentation

## 2016-11-17 DIAGNOSIS — C50412 Malignant neoplasm of upper-outer quadrant of left female breast: Secondary | ICD-10-CM | POA: Insufficient documentation

## 2016-11-17 DIAGNOSIS — F419 Anxiety disorder, unspecified: Secondary | ICD-10-CM | POA: Insufficient documentation

## 2016-11-17 DIAGNOSIS — K219 Gastro-esophageal reflux disease without esophagitis: Secondary | ICD-10-CM | POA: Diagnosis not present

## 2016-11-17 NOTE — Progress Notes (Signed)
Patient denies any concerns today.  

## 2017-02-06 ENCOUNTER — Ambulatory Visit: Payer: BLUE CROSS/BLUE SHIELD | Admitting: Radiation Oncology

## 2017-02-15 ENCOUNTER — Encounter: Payer: Self-pay | Admitting: Radiation Oncology

## 2017-02-15 ENCOUNTER — Ambulatory Visit
Admission: RE | Admit: 2017-02-15 | Discharge: 2017-02-15 | Disposition: A | Discharge status: Home / Self Care | Payer: BLUE CROSS/BLUE SHIELD | Source: Ambulatory Visit | Attending: Radiation Oncology | Admitting: Radiation Oncology

## 2017-02-15 VITALS — BP 149/92 | HR 73 | Temp 98.0°F | Resp 20 | Wt 179.9 lb

## 2017-02-15 DIAGNOSIS — Z79811 Long term (current) use of aromatase inhibitors: Secondary | ICD-10-CM | POA: Diagnosis not present

## 2017-02-15 DIAGNOSIS — Z923 Personal history of irradiation: Secondary | ICD-10-CM | POA: Diagnosis not present

## 2017-02-15 DIAGNOSIS — C50412 Malignant neoplasm of upper-outer quadrant of left female breast: Secondary | ICD-10-CM

## 2017-02-15 DIAGNOSIS — C50912 Malignant neoplasm of unspecified site of left female breast: Secondary | ICD-10-CM | POA: Diagnosis present

## 2017-02-15 DIAGNOSIS — Z170 Estrogen receptor positive status [ER+]: Secondary | ICD-10-CM | POA: Insufficient documentation

## 2017-02-15 NOTE — Progress Notes (Signed)
Radiation Oncology Follow up Note  Name: Jill Reilly   Date:   02/15/2017 MRN:  [id number] DOB: 07/18/1955    This 61 y.o. 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  REFERRING PROVIDER: No ref. 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 Seen today in routine follow-up she is doing well. She specifically denies breast tenderness cough or bone pain. She has mammogram scheduled for next month..she is currently on letrozole tolerating that well without side effect.  COMPLICATIONS OF TREATMENT: none  FOLLOW UP COMPLIANCE: keeps appointments   PHYSICAL EXAM:  BP (!) 149/92   Pulse 73   Temp 98 F (36.7 C)   Resp 20   Wt 179 lb 14.3 oz (81.6 kg)   BMI 31.87 kg/m  Lungs are clear to A&P cardiac examination essentially unremarkable with regular rate and rhythm. No dominant mass or nodularity is noted in either breast in 2 positions examined. Incision is well-healed. No axillary or supraclavicular adenopathy is appreciated. Cosmetic result is excellent. Well-developed well-nourished patient in NAD. HEENT reveals PERLA, EOMI, discs not visualized.  Oral cavity is clear. No oral mucosal lesions are identified. Neck is clear without evidence of cervical or supraclavicular adenopathy. Lungs are clear to A&P. Cardiac examination is essentially unremarkable with regular rate and rhythm without murmur rub or thrill. Abdomen is benign with no organomegaly or masses noted. Motor sensory and DTR levels are equal and symmetric in the upper and lower extremities. Cranial nerves II through XII are grossly intact. Proprioception is intact. No peripheral adenopathy or edema is identified. No motor or sensory levels  are noted. Crude visual fields are within normal range.  RADIOLOGY RESULTS: no current films for review  PLAN: present time patient is doing well with no evidence of disease 6 months out. I'm please were overall progress. I've asked to see her back in 6 months for follow-up and then will start once your follow-up visits. She will have a mammogram next month. She continues on letrozole without side effect. She knows to call with any concerns.  I would like to take this opportunity to thank you for allowing me to participate in the care of your patient.Armstead Peaks., MD

## 2017-03-09 ENCOUNTER — Ambulatory Visit
Admission: RE | Admit: 2017-03-09 | Discharge: 2017-03-09 | Disposition: A | Discharge status: Home / Self Care | Payer: BLUE CROSS/BLUE SHIELD | Source: Ambulatory Visit | Attending: Oncology | Admitting: Oncology

## 2017-03-09 DIAGNOSIS — C50412 Malignant neoplasm of upper-outer quadrant of left female breast: Secondary | ICD-10-CM

## 2017-03-09 DIAGNOSIS — Z08 Encounter for follow-up examination after completed treatment for malignant neoplasm: Secondary | ICD-10-CM | POA: Diagnosis not present

## 2017-03-09 DIAGNOSIS — Z853 Personal history of malignant neoplasm of breast: Secondary | ICD-10-CM | POA: Diagnosis not present

## 2017-03-09 DIAGNOSIS — Z9889 Other specified postprocedural states: Secondary | ICD-10-CM | POA: Diagnosis not present

## 2017-03-09 HISTORY — DX: Personal history of irradiation: Z92.3

## 2017-05-20 NOTE — Progress Notes (Signed)
[place]  [phone] [phone]  ID: Jill Reilly OB: [birth year]  MR#: [id number]  UXN#:[id number]  Patient Care Team: System, Pcp Not In as PCP - General  CHIEF COMPLAINT: Pathologic stage Ia ER/PR positive, HER-2 negative invasive carcinoma of the upper outer quadrant of the left breast. Low risk Mammoprint.  INTERVAL HISTORY: Patient returns to clinic today for routine 51-monthevaluation.  She is tolerating letrozole well without significant side effects.  She currently feels well and is asymptomatic. She has no neurologic complaints. She has a good appetite and denies any weight loss. She has no recent fevers or illnesses. She denies any chest pain or shortness of breath. She denies any nausea, vomiting, constipation, or diarrhea. She has no urinary complaints. Patient offers no specific complaints today.  REVIEW OF SYSTEMS:   Review of Systems  Constitutional: Negative.  Negative for fever, malaise/fatigue and weight loss.  Respiratory: Negative.  Negative for cough.   Cardiovascular: Negative.  Negative for chest pain and leg swelling.  Gastrointestinal: Negative.  Negative for abdominal pain.  Genitourinary: Negative.   Musculoskeletal: Negative.   Skin: Negative.  Negative for rash.  Neurological: Negative.  Negative for sensory change and weakness.  Psychiatric/Behavioral: Negative.  The patient is not nervous/anxious.     As per HPI. Otherwise, a complete review of systems is negative.  PAST MEDICAL HISTORY: Past Medical History:  Diagnosis Date  . Anxiety   . Arthritis    FINGERS  . Cancer ([place] 03/2016   left breast  . GERD (gastroesophageal reflux disease)   . Headache    H/O MIGRAINES  . Personal history of radiation therapy 2017/2018   left breast ca    PAST SURGICAL HISTORY: Past Surgical History:  Procedure Laterality Date  . BILATERAL CARPAL TUNNEL RELEASE    . BREAST BIOPSY Left 03/31/2016   invasive  mammary  . BREAST LUMPECTOMY Left 04/29/2016   invasive mammary carcinoma. Clear margins  . CESAREAN SECTION    . CHOLECYSTECTOMY    . PARTIAL MASTECTOMY WITH NEEDLE LOCALIZATION Left 04/29/2016   Procedure: PARTIAL MASTECTOMY WITH NEEDLE LOCALIZATION;  Surgeon: JLeonie Green MD;  Location: [facility] ORS;  Service: General;  Laterality: Left;  . SENTINEL NODE BIOPSY Left 04/29/2016   Procedure: SENTINEL NODE BIOPSY;  Surgeon: JLeonie Green MD;  Location: [facility] ORS;  Service: General;  Laterality: Left;    FAMILY HISTORY: Family History  Problem Relation Age of Onset  . Breast cancer Neg Hx     ADVANCED DIRECTIVES (Y/N):  N  HEALTH MAINTENANCE: Social History   Tobacco Use  . Smoking status: Never Smoker  . Smokeless tobacco: Never Used  Substance Use Topics  . Alcohol use: Yes    Comment: occasional  . Drug use: No     Colonoscopy:  PAP:  Bone density:  Lipid panel:  No Known Allergies  Current Outpatient Medications  Medication Sig Dispense Refill  . acetaminophen (TYLENOL) 500 MG tablet Take 1,000 mg by mouth every 6 (six) hours as needed for moderate pain or headache.    .Marland Kitchenaspirin EC 81 MG tablet Take 81 mg by mouth daily.    . Ca Carbonate-Mag Hydroxide (ROLAIDS PO) Take 1 tablet by mouth as needed.    .Marland Kitchenibuprofen (ADVIL,MOTRIN) 200 MG tablet Take 400 mg by mouth every 6 (six) hours as needed for headache or moderate pain.    .Marland Kitchenletrozole (FEMARA) 2.5 MG tablet Take 1 tablet (2.5 mg total) by mouth daily.  90 tablet 3  . Multiple Vitamin (MULTI-VITAMINS) TABS Take by mouth.    . Probiotic Product (PHILLIPS COLON HEALTH PO) Take 1 capsule by mouth daily.      No current facility-administered medications for this visit.     OBJECTIVE: Vitals:   05/22/17 1135  BP: 130/86  Pulse: 67  Resp: 20  Temp: (!) 97.2 F (36.2 C)     Body mass index is 31.62 kg/m.    ECOG FS:0 - Asymptomatic  General: Well-developed, well-nourished, no acute distress. Eyes:  Pink conjunctiva, anicteric sclera. Breasts: Patient reports a normal breast exam from a different provider yesterday. Lungs: Clear to auscultation bilaterally. Heart: Regular rate and rhythm. No rubs, murmurs, or gallops. Abdomen: Soft, nontender, nondistended. No organomegaly noted, normoactive bowel sounds. Musculoskeletal: No edema, cyanosis, or clubbing. Neuro: Alert, answering all questions appropriately. Cranial nerves grossly intact. Skin: No rashes or petechiae noted. Psych: Normal affect.   LAB RESULTS:  Lab Results  Component Value Date   K 3.9 04/25/2016    Lab Results  Component Value Date   WBC 4.5 07/26/2016   HGB 15.1 07/26/2016   HCT 43.1 07/26/2016   MCV 87.6 07/26/2016   PLT 233 07/26/2016     STUDIES: No results found.  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 Low risk Mammoprint.  PLAN:    1. 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 Low risk Mammoprint: Patient completed adjuvant XRT in April 2018.  She did not require chemotherapy given her low risk Mammoprint.  Continue letrozole for a total of 5 years completing in April 2023.  Her most recent mammogram on March 09, 2017 was reported as BI-RADS 2.  Repeat in October 2019.  Return to clinic in 6 months for routine evaluation. 2.  Osteopenia: Patient had a bone marrow density on August 30, 2016 was reported T score of -1.2.  This does not need to be repeated until April 2020.  Continue calcium and vitamin D supplementation as directed.  Approximately 20 minutes was spent in discussion of which greater than 50% was consultation.  Patient expressed understanding and was in agreement with this plan. She also understands that She can call clinic at any time with any questions, concerns, or complaints.   Cancer Staging Primary cancer of upper  outer quadrant of left female breast [facility]) Staging form: Breast, AJCC 7th Edition - Pathologic stage from 05/09/2016: Stage IA (T1c, N0, cM0) - Signed by Lloyd Huger, MD on 05/09/2016    Lloyd Huger, MD 05/22/17 11:41 AM

## 2017-05-22 ENCOUNTER — Other Ambulatory Visit: Payer: Self-pay

## 2017-05-22 ENCOUNTER — Encounter: Payer: Self-pay | Admitting: Oncology

## 2017-05-22 ENCOUNTER — Inpatient Hospital Stay: Payer: BLUE CROSS/BLUE SHIELD | Attending: Oncology | Admitting: Oncology

## 2017-05-22 VITALS — BP 130/86 | HR 67 | Temp 97.2°F | Resp 20 | Wt 178.5 lb

## 2017-05-22 DIAGNOSIS — C50412 Malignant neoplasm of upper-outer quadrant of left female breast: Secondary | ICD-10-CM

## 2017-05-22 DIAGNOSIS — Z79811 Long term (current) use of aromatase inhibitors: Secondary | ICD-10-CM | POA: Insufficient documentation

## 2017-05-22 DIAGNOSIS — Z923 Personal history of irradiation: Secondary | ICD-10-CM | POA: Insufficient documentation

## 2017-05-22 DIAGNOSIS — M1990 Unspecified osteoarthritis, unspecified site: Secondary | ICD-10-CM | POA: Diagnosis not present

## 2017-05-22 DIAGNOSIS — Z9012 Acquired absence of left breast and nipple: Secondary | ICD-10-CM | POA: Insufficient documentation

## 2017-05-22 DIAGNOSIS — F419 Anxiety disorder, unspecified: Secondary | ICD-10-CM | POA: Insufficient documentation

## 2017-05-22 DIAGNOSIS — Z7982 Long term (current) use of aspirin: Secondary | ICD-10-CM | POA: Insufficient documentation

## 2017-05-22 DIAGNOSIS — K219 Gastro-esophageal reflux disease without esophagitis: Secondary | ICD-10-CM | POA: Insufficient documentation

## 2017-05-22 DIAGNOSIS — Z170 Estrogen receptor positive status [ER+]: Secondary | ICD-10-CM | POA: Diagnosis not present

## 2017-05-22 DIAGNOSIS — M8580 Other specified disorders of bone density and structure, unspecified site: Secondary | ICD-10-CM | POA: Diagnosis not present

## 2017-05-22 NOTE — Progress Notes (Signed)
Patient denies any concerns today.  

## 2017-08-10 IMAGING — US US BREAST*L* LIMITED INC AXILLA
1 series · 13 of 25 positions shown · non-contrast
Comparison: 03/01/2016 and 07/18/2011 ( from [REDACTED])

CLINICAL DATA: The patient returns after screening study for
evaluation of possible left breast mass.

EXAM:
2D DIGITAL DIAGNOSTIC LEFT MAMMOGRAM WITH CAD AND ADJUNCT TOMO
ULTRASOUND LEFT BREAST

[Series 1: us breast*left* limited inc axilla · 0.07mm/px · 13 of 41 slices shown]
[im 1/41]
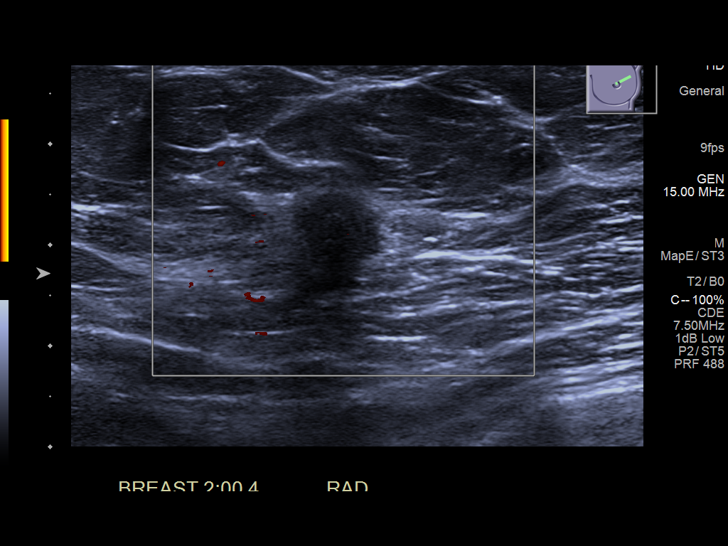
[im 4/41]
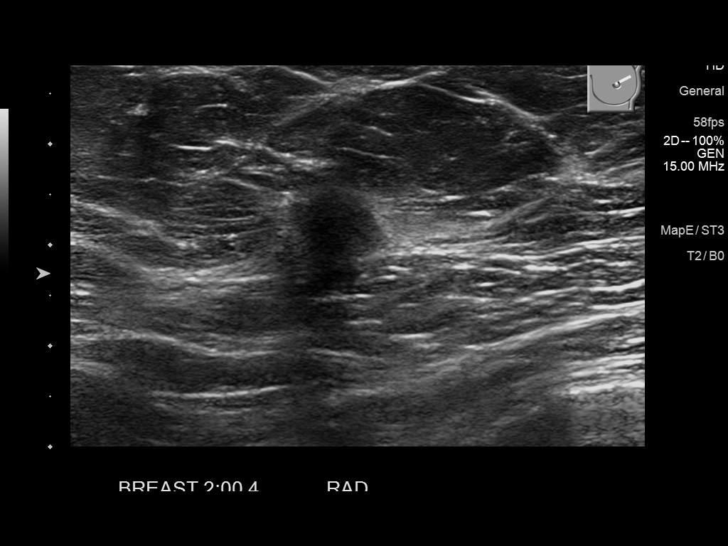
[im 7/41]
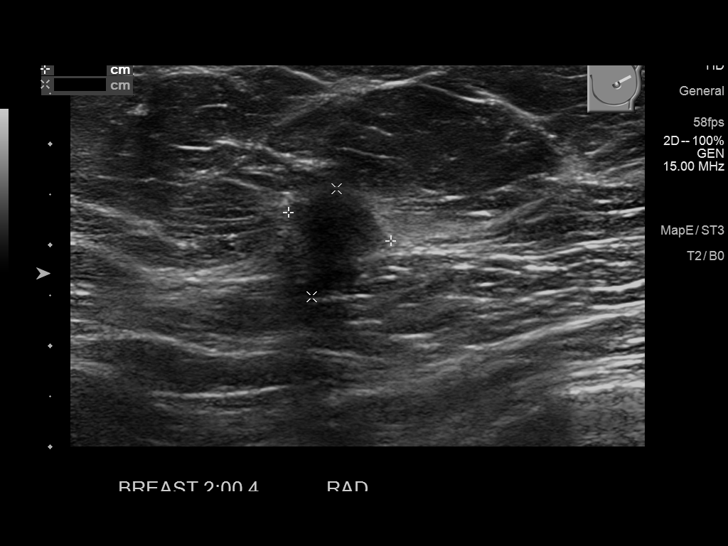
[im 11/41]
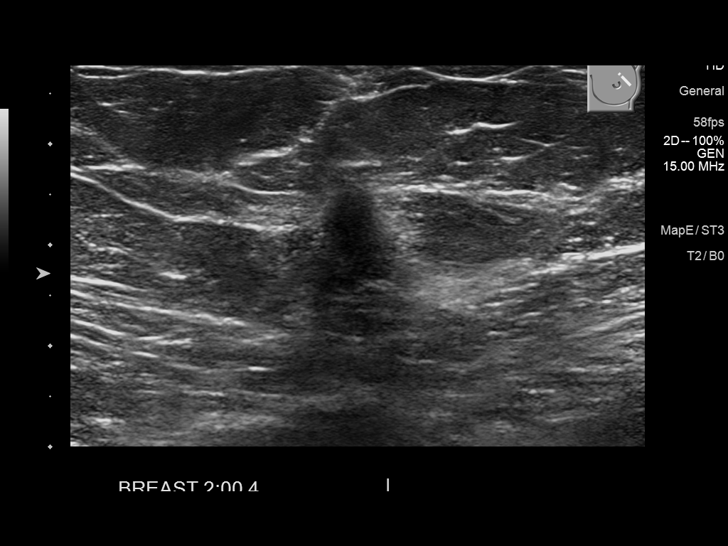
[im 14/41]
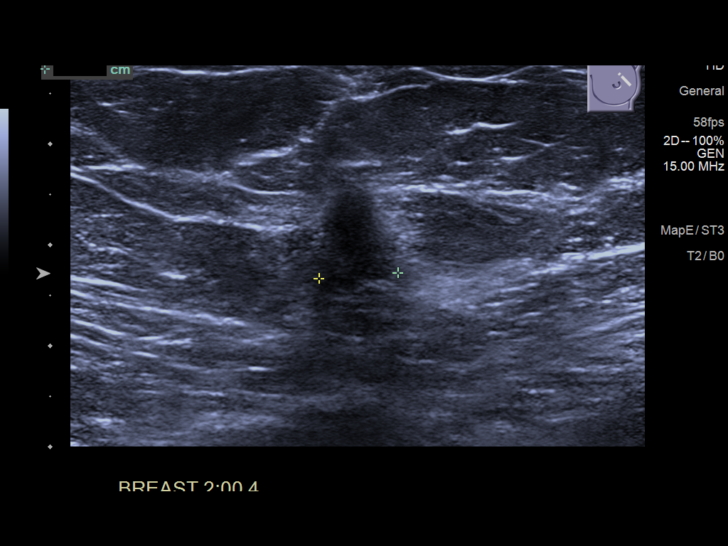
[im 17/41]
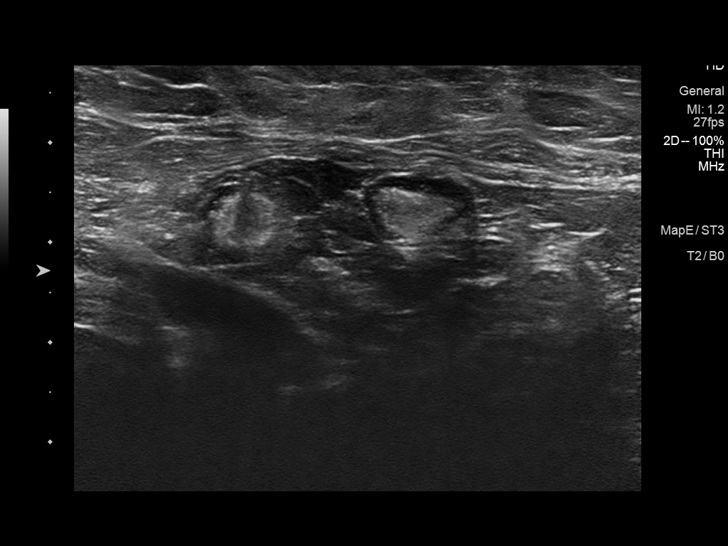
[im 21/41]
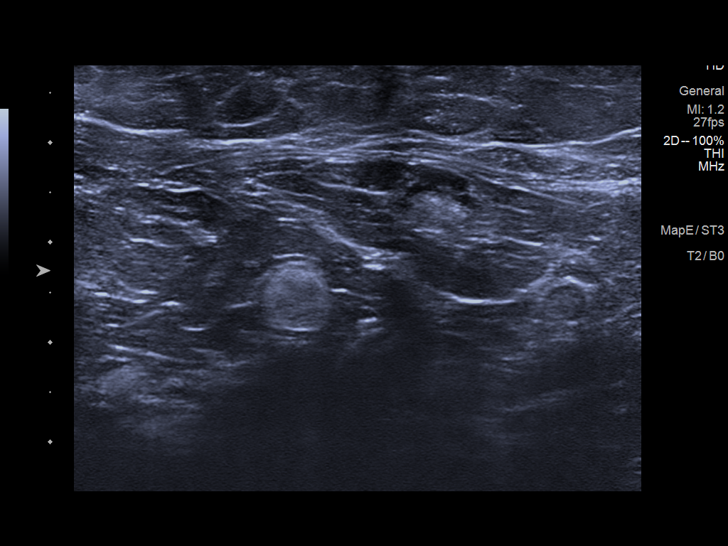
[im 24/41]
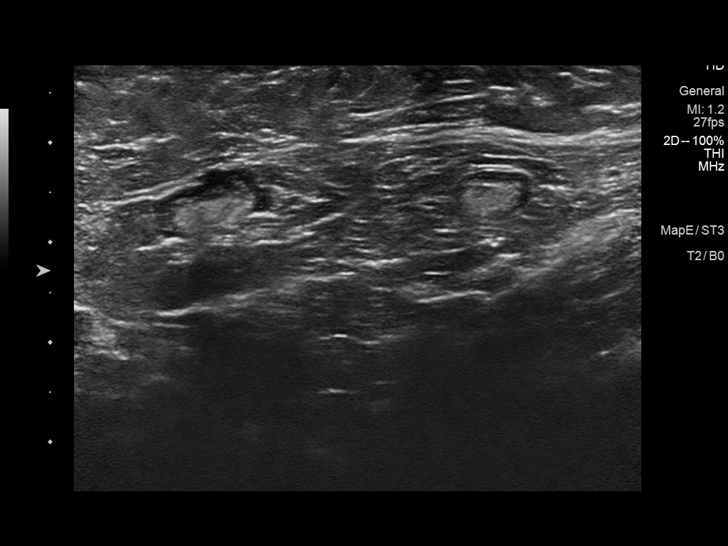
[im 27/41]
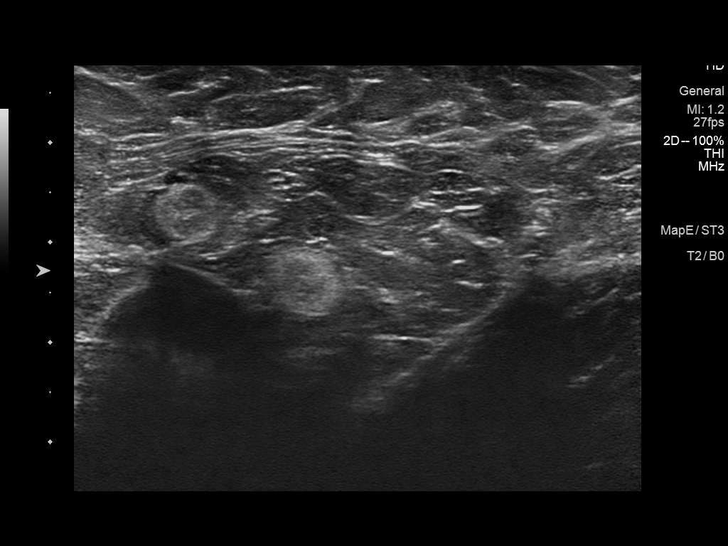
[im 31/41]
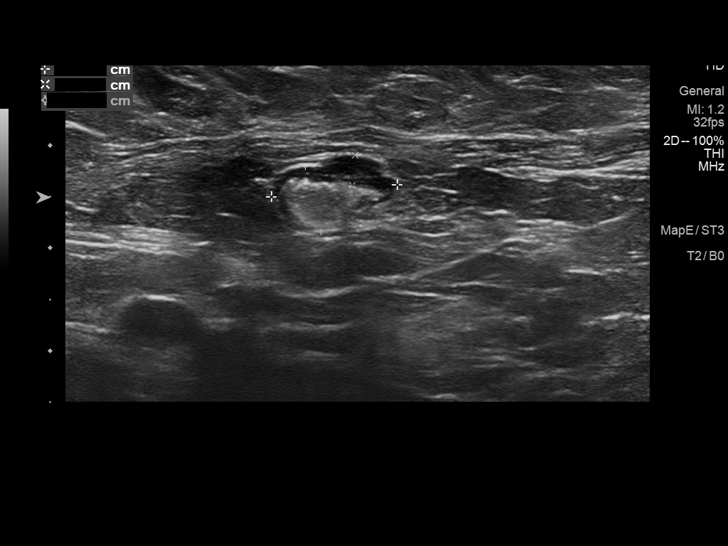
[im 34/41]
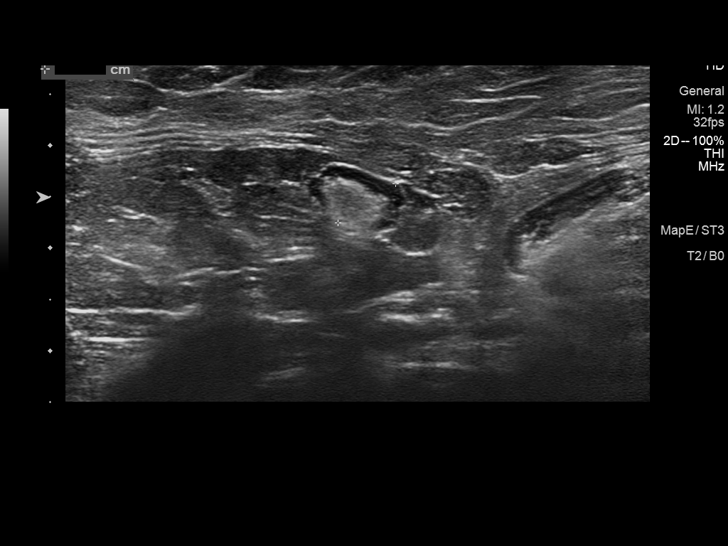
[im 37/41]
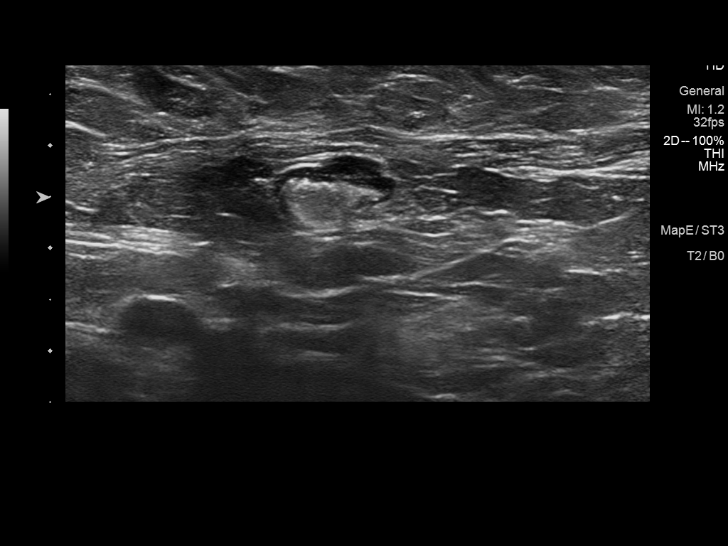
[im 41/41]
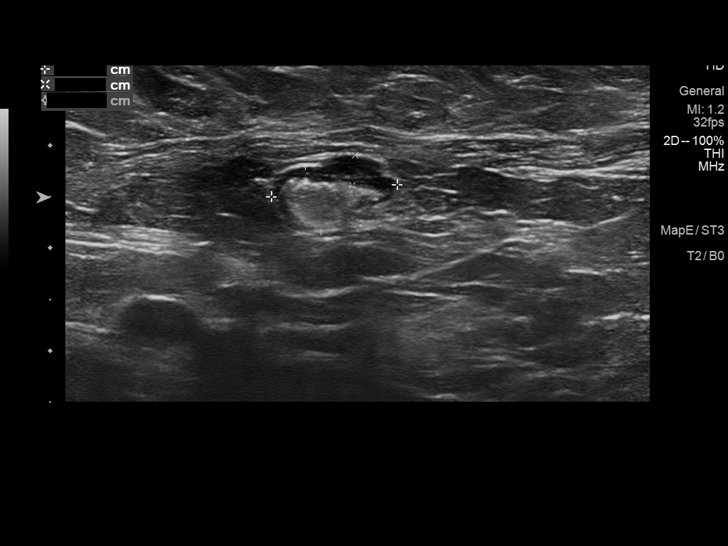

[13 of 25 positions shown; findings below may reference images not displayed]

ACR Breast Density Category c: The breast tissue is heterogeneously
dense, which may obscure small masses.
FINDINGS: Tomosynthesis images confirm presence of a spiculated mass in the
upper-outer quadrant of the right breast.

Mammographic images were processed with CAD.

On physical exam, I palpate soft thickening in the 2 o'clock
location of the left breast.

Targeted ultrasound is performed, showing irregular hypoechoic
taller than wide mass in the 2 o'clock location of the left breast 4
cm from nipple which measures 1.1 x 1.1 x 0.8 cm. There is adjacent
vascularity on Doppler evaluation. No enlarged axillary lymph nodes
identified by ultrasound.
IMPRESSION: Suspicious mass in the 2 o'clock location of the left breast 4 cm
from the nipple corresponding to the spiculated mass identified
mammographically.

No imaging evidence for adenopathy.

RECOMMENDATION:
Ultrasound-guided core biopsy of mass in the 2 o'clock location of
the left breast.

I have discussed the findings and recommendations with the patient.
Results were also provided in writing at the conclusion of the
visit. If applicable, a reminder letter will be sent to the patient
regarding the next appointment.

BI-RADS CATEGORY  4: Suspicious.

## 2017-08-18 IMAGING — MG MM BREAST LOCALIZATION CLIP
2 series · 2 of 2 positions shown · non-contrast
Comparison: Previous exam(s).

CLINICAL DATA: Evaluate biopsy clip

EXAM:
DIAGNOSTIC LEFT MAMMOGRAM POST ULTRASOUND BIOPSY

[L CC]
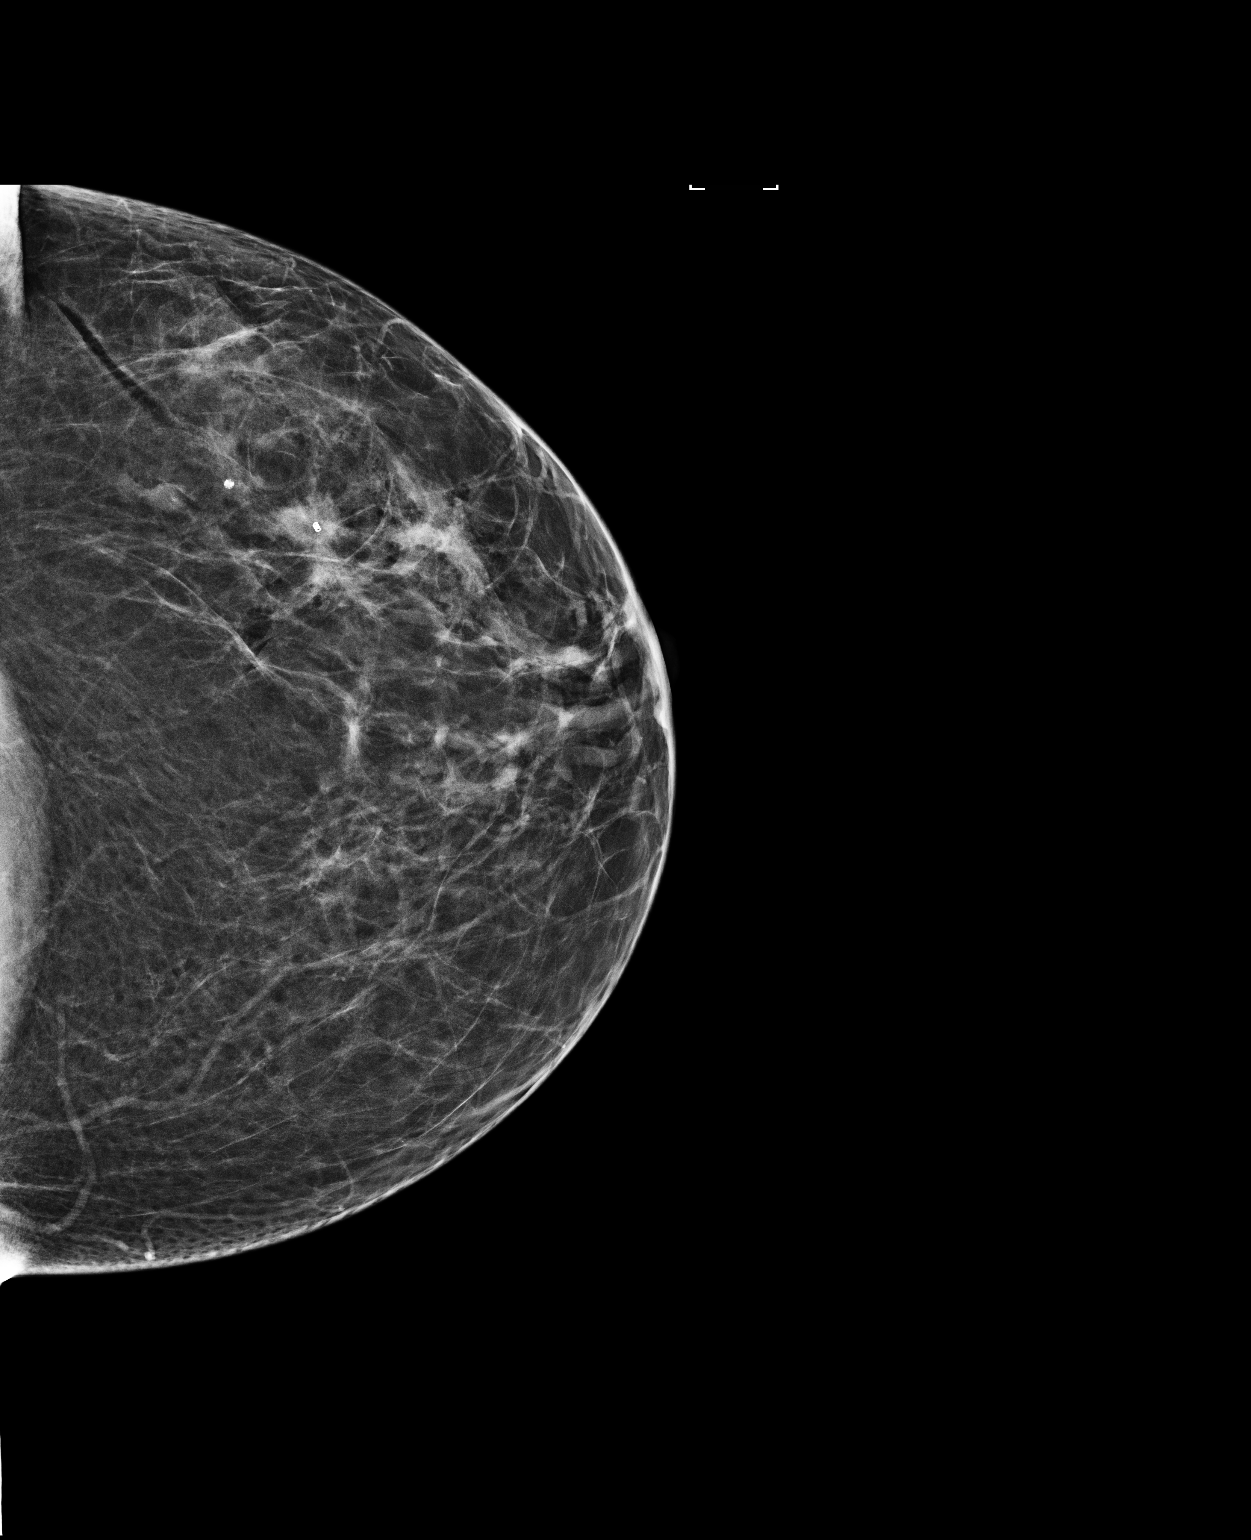

[L ML]
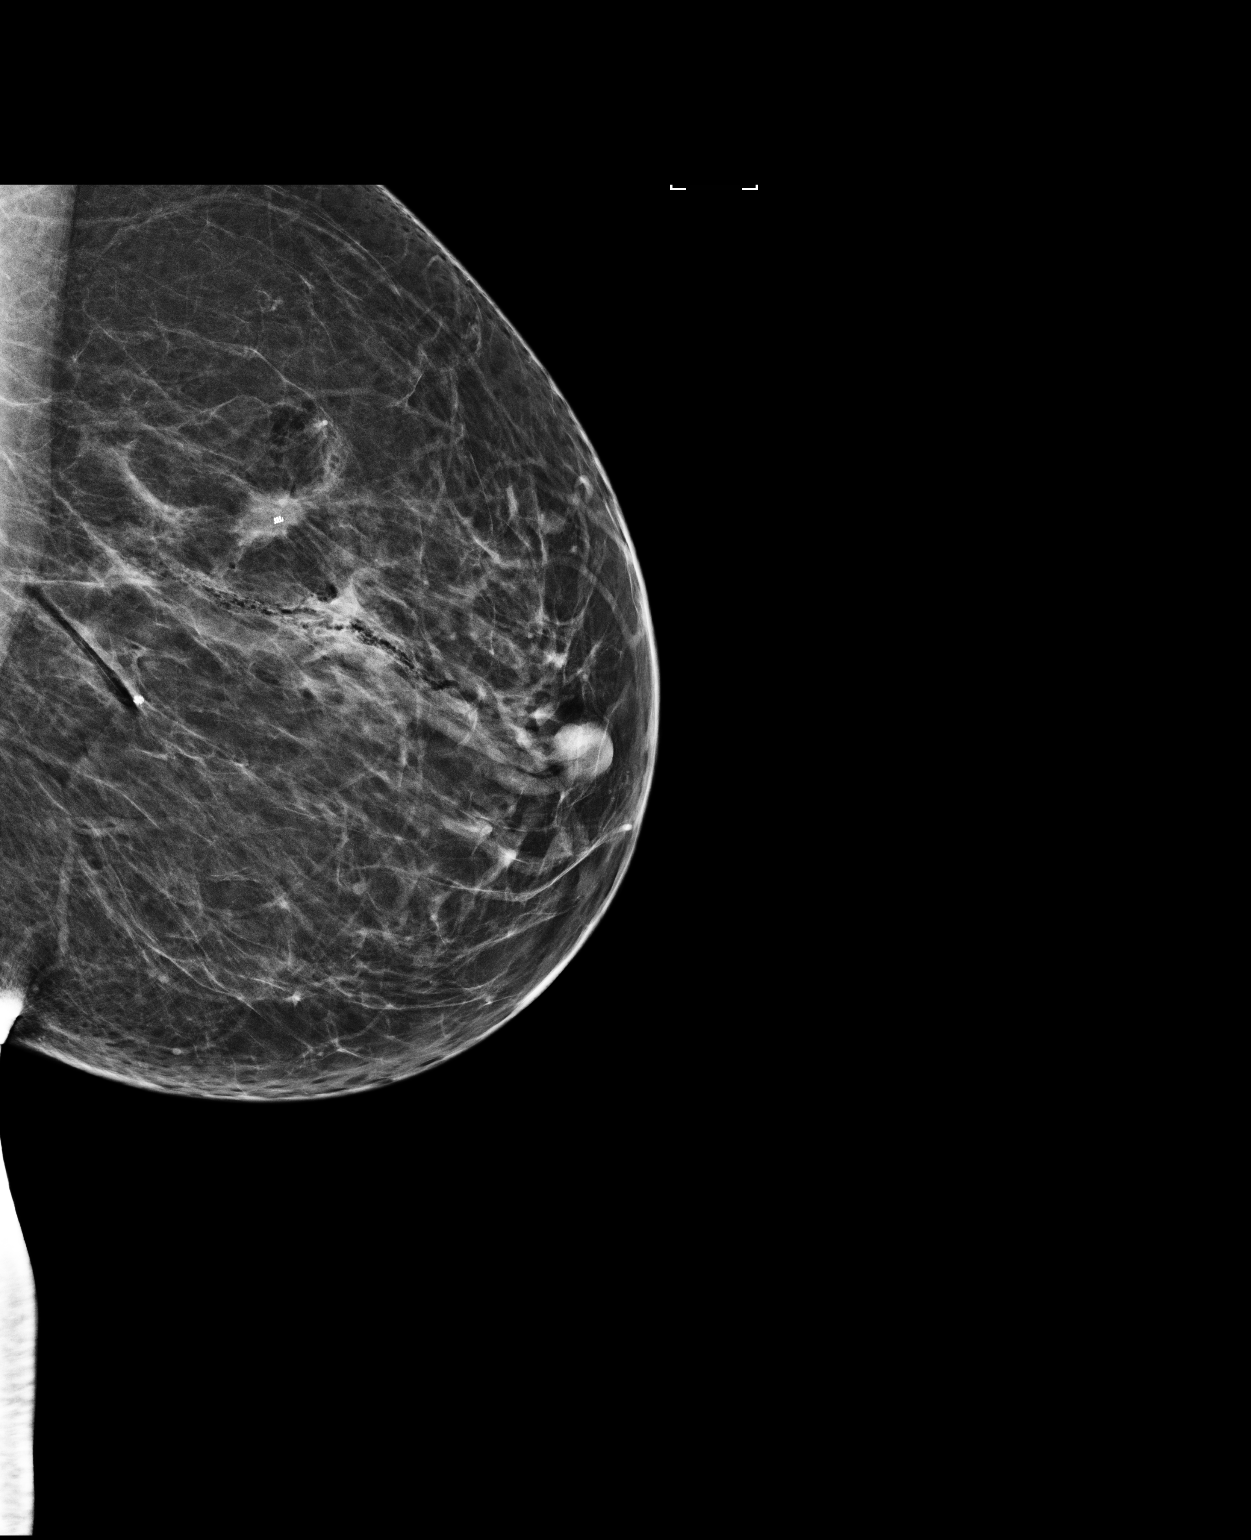

[2 of 2 positions shown; findings below may reference images not displayed]

FINDINGS: Mammographic images were obtained following ultrasound guided biopsy
of a left breast mass. The coil shaped clip is within the biopsied
mass.
IMPRESSION: Appropriate clip placement after biopsy.

Final Assessment: Post Procedure Mammograms for Marker Placement

## 2017-08-18 IMAGING — MG US BREAST BX W LOC DEV 1ST LESION IMG BX SPEC US GUIDE*L*
1 series · 8 of 8 positions shown · non-contrast
Comparison: Previous exam(s).

ADDENDUM:
Pathology of the left breast biopsy revealed LEFT BREAST, [DATE], 4
CMFN; BIOPSY: INVASIVE MAMMARY CARCINOMA OF NO SPECIAL TYPE.
PRELIMINARY GRADE: 1 (TANAISHA HISTOLOGIC GRADE). LOW-GRADE DUCTAL
CARCINOMA IN SITU WITH MICROCALCIFICATIONS. Note: ER, PR and
HER2-Rudi immunohistochemistry is obtained and results will be issued
in an addendum. HER2 FISH will be performed for equivocal results.
Final histologic grade should be based on the excised tumor. Results
were called to Ritzl Zotya in the [HOSPITAL] on
04/01/16 at [DATE]. This was found to be concordant by Dr.
Kaba.

Recommendations:  Surgical and oncology referrals.
Results and recommendations were relayed to Dr. Jumper nurse by
phone by Ritzl Zotya, RT, M, mammography supervisor at
[HOSPITAL]. The nurse requested the pathology report be
faxed to their office. She was going to discuss results with Dr.
Zw and notify Ritzl Zotya as to relaying results to the
patient and making referral.
The patient was contacted by phone with results and recommendations
by Dr. Rogui on 04/06/16. The patient stated she has done well
following the biopsy with only bruising which has started to
resolve. All of her questions were answered. She was informed that
the nurse navigators from [HOSPITAL] would
contact her with referral information. She was encouraged to contact
the [HOSPITAL] with any further questions or concerns.
An appointment was made with Dr. Blain, oncologist for 04/08/16
at [DATE] and Dr. Evgeniya, surgeon for 04/08/16 at [DATE] by Giedrelis
Bolkonsky, RN, nurse navigator. The patient has been notified of the
appointment information.
Addendum by Gala, Lamonica on 04/07/16.
CLINICAL DATA: Left breast mass
EXAM:
ULTRASOUND GUIDED LEFT BREAST CORE NEEDLE BIOPSY

[Series 1: MG view · 0.07mm/px · 8 of 14 slices shown]
[im 1/14]
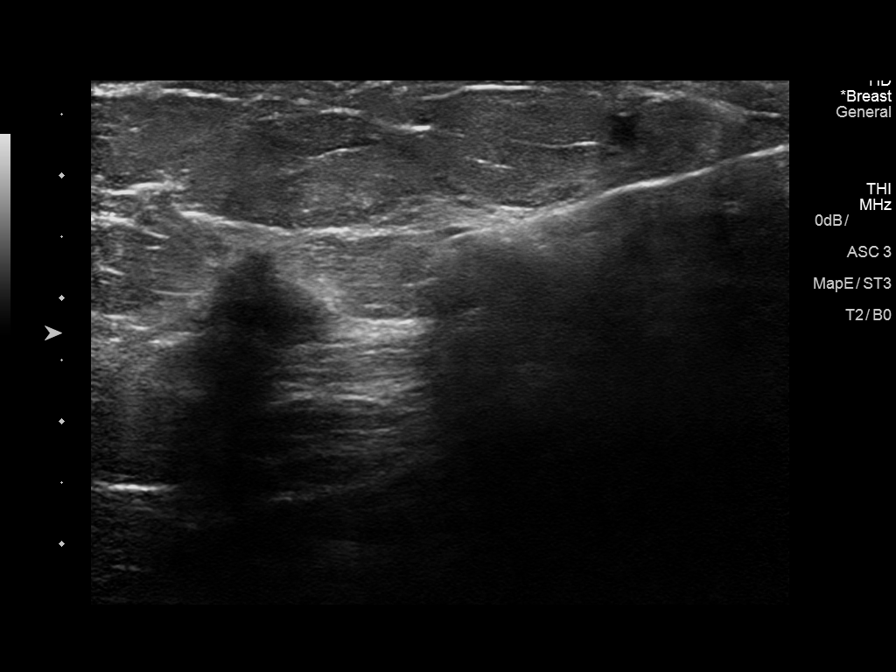
[im 2/14]
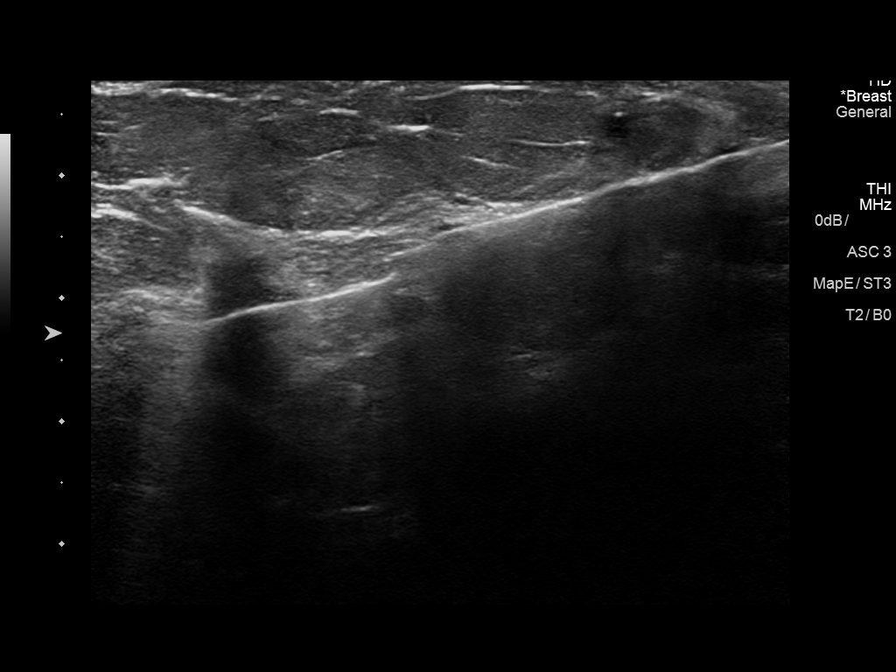
[im 4/14]
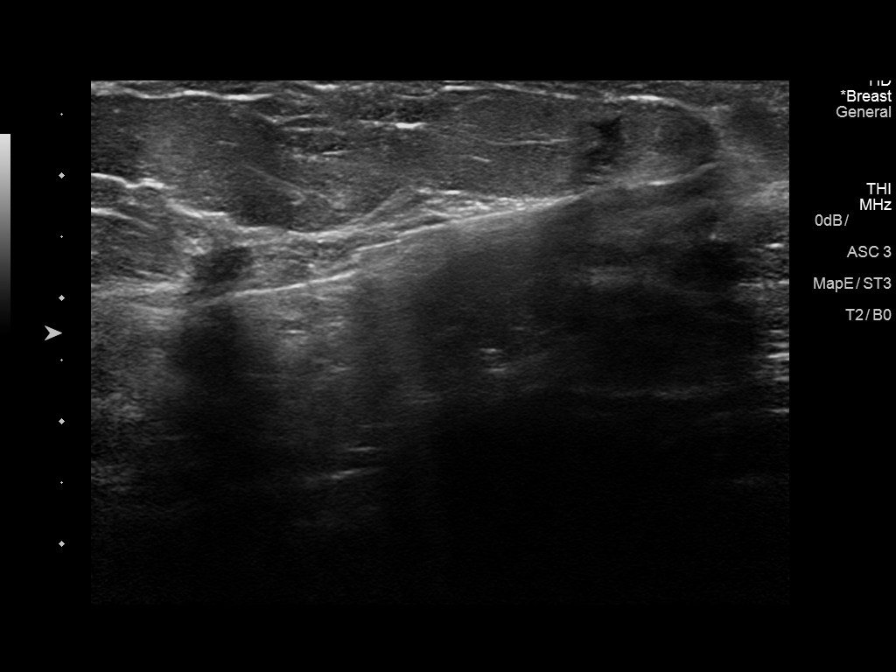
[im 6/14]
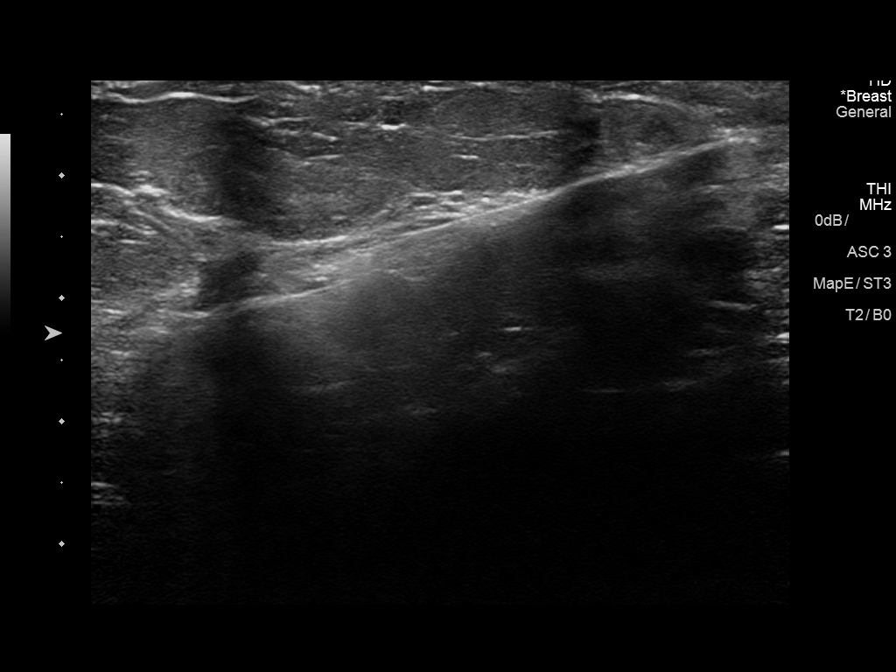
[im 8/14]
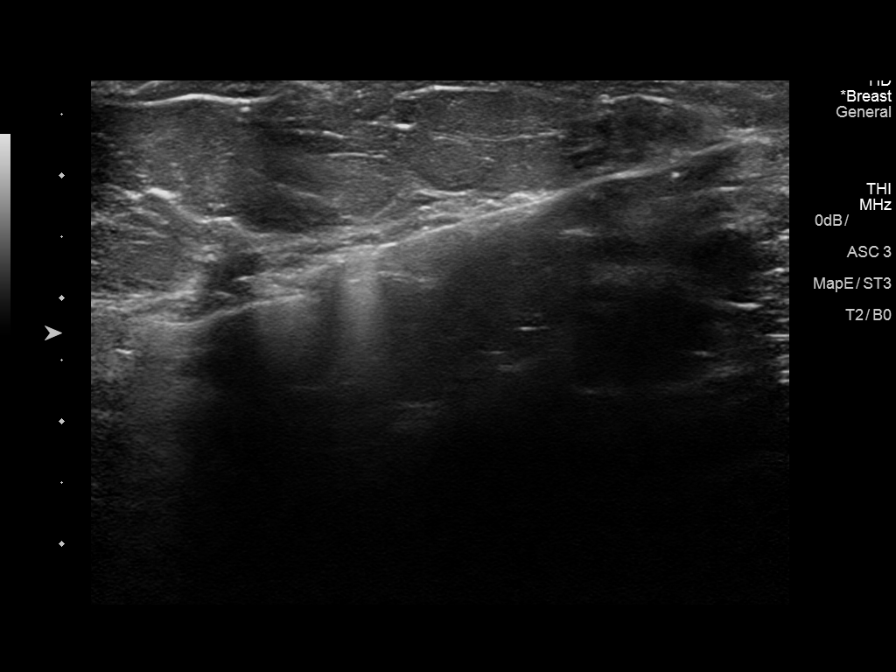
[im 10/14]
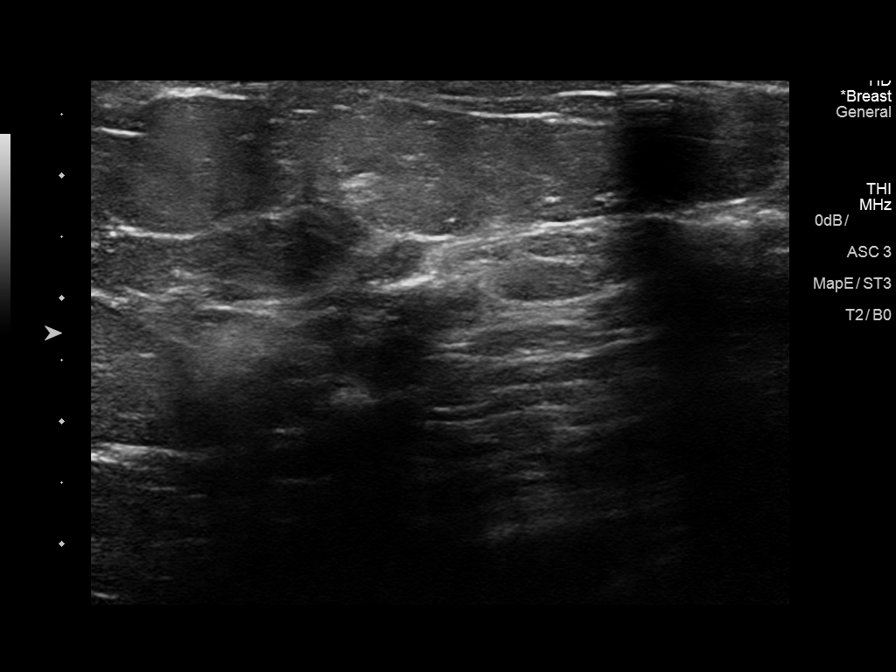
[im 12/14]
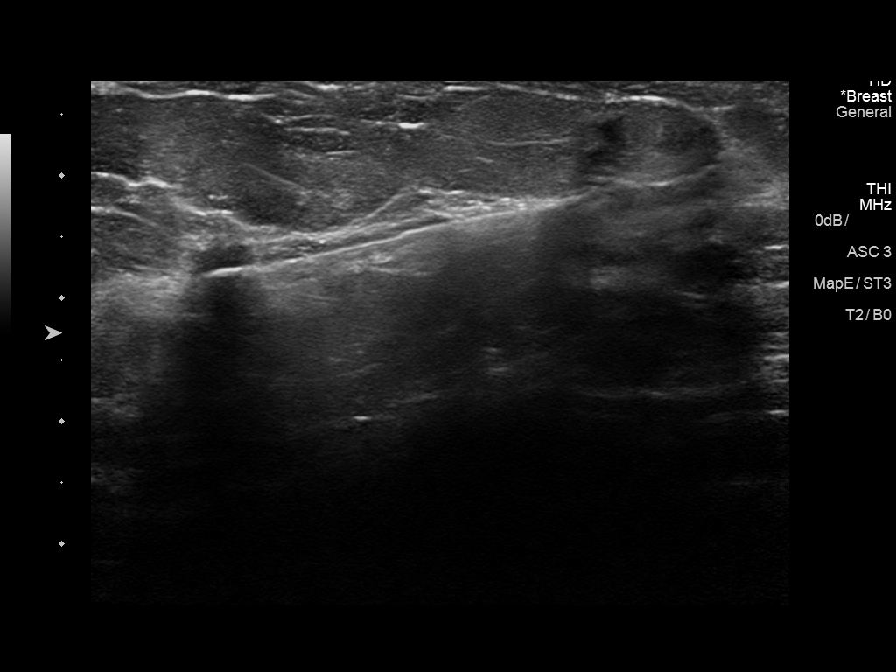
[im 14/14]
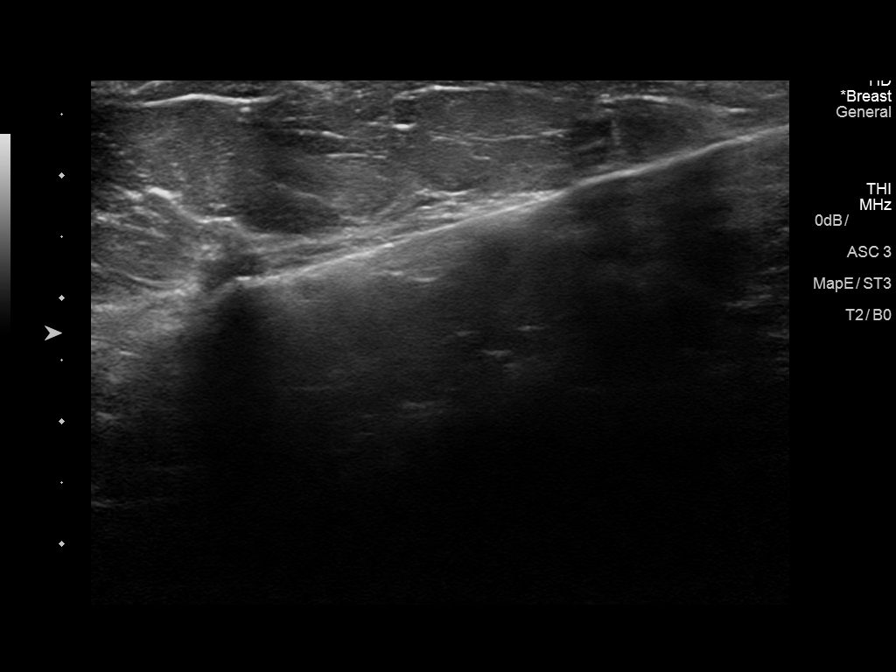

[8 of 8 positions shown; findings below may reference images not displayed]



Using sterile technique and 1% Lidocaine as local anesthetic, under
direct ultrasound visualization, a 12 gauge Pauline Nadege device was
used to perform biopsy of a left breast mass using a lateral
approach. At the conclusion of the procedure a tissue marker clip
was deployed into the biopsy cavity. Follow up 2 view mammogram was
performed and dictated separately.
IMPRESSION: Ultrasound guided biopsy of a left breast mass. No apparent
complications.

## 2017-08-24 ENCOUNTER — Ambulatory Visit
Admission: RE | Admit: 2017-08-24 | Discharge: 2017-08-24 | Disposition: A | Discharge status: Home / Self Care | Payer: BLUE CROSS/BLUE SHIELD | Source: Ambulatory Visit | Attending: Radiation Oncology | Admitting: Radiation Oncology

## 2017-08-24 ENCOUNTER — Other Ambulatory Visit: Payer: Self-pay

## 2017-08-24 VITALS — BP 166/95 | HR 68 | Temp 97.7°F | Resp 12 | Ht 62.0 in | Wt 181.9 lb

## 2017-08-24 DIAGNOSIS — C50412 Malignant neoplasm of upper-outer quadrant of left female breast: Secondary | ICD-10-CM

## 2017-08-24 DIAGNOSIS — Z170 Estrogen receptor positive status [ER+]: Secondary | ICD-10-CM | POA: Insufficient documentation

## 2017-08-24 DIAGNOSIS — Z923 Personal history of irradiation: Secondary | ICD-10-CM | POA: Insufficient documentation

## 2017-08-24 DIAGNOSIS — Z79811 Long term (current) use of aromatase inhibitors: Secondary | ICD-10-CM | POA: Insufficient documentation

## 2017-08-24 NOTE — Progress Notes (Signed)
Radiation Oncology Follow up Note  Name: Jill Reilly   Date:   08/24/2017 MRN:  [id number] DOB: 08/18/1955    This 62 y.o. female presents to the clinic today for  Year follow-up status post whole breast radiation to her left breast for stage I ER/PR positive invasive mammary carcinoma.  REFERRING PROVIDER: No ref. provider found  HPI: patient is a 62 year old female now out 1 year having cleared whole breast radiation to her left breast for stage I ER/PR positive invasive mammary carcinoma seen today in routine follow-up she is doing well. She specifically denies breast tenderness cough or bone pain..she had mammograms back in October which I have reviewed BI-RADS 2 benign.she's currently on Femara tolerate that well without side effect.  COMPLICATIONS OF TREATMENT: none  FOLLOW UP COMPLIANCE: keeps appointments   PHYSICAL EXAM:  BP (!) 166/95 (BP Location: Right Arm, Patient Position: Sitting, Cuff Size: Large)   Pulse 68   Temp 97.7 F (36.5 C) (Tympanic)   Resp 12   Ht 5\' 2"  (1.575 m)   Wt 181 lb 14.1 oz (82.5 kg)   BMI 33.27 kg/m  Lungs are clear to A&P cardiac examination essentially unremarkable with regular rate and rhythm. No dominant mass or nodularity is noted in either breast in 2 positions examined. Incision is well-healed. No axillary or supraclavicular adenopathy is appreciated. Cosmetic result is excellent. Well-developed well-nourished patient in NAD. HEENT reveals PERLA, EOMI, discs not visualized.  Oral cavity is clear. No oral mucosal lesions are identified. Neck is clear without evidence of cervical or supraclavicular adenopathy. Lungs are clear to A&P. Cardiac examination is essentially unremarkable with regular rate and rhythm without murmur rub or thrill. Abdomen is benign with no organomegaly or masses noted. Motor sensory and DTR levels are equal and symmetric in the upper and lower extremities. Cranial nerves II through XII are grossly intact. Proprioception is  intact. No peripheral adenopathy or edema is identified. No motor or sensory levels are noted. Crude visual fields are within normal range.  RADIOLOGY RESULTS: mammograms reviewed compatible above-stated findings  PLAN: present time patient continues to do well with no evidence of disease. I've asked to see her back in 1 year for follow-up. She continues on Femara without side effect. She or he has follow-up mammograms ordered. Patient is to call with any concerns.  I would like to take this opportunity to thank you for allowing me to participate in the care of your patient.Noreene Filbert, MD

## 2017-09-05 ENCOUNTER — Other Ambulatory Visit: Payer: Self-pay | Admitting: Sports Medicine

## 2017-09-05 DIAGNOSIS — M25561 Pain in right knee: Principal | ICD-10-CM

## 2017-09-05 DIAGNOSIS — G8929 Other chronic pain: Secondary | ICD-10-CM

## 2017-09-13 ENCOUNTER — Ambulatory Visit
Admission: RE | Admit: 2017-09-13 | Discharge: 2017-09-13 | Disposition: A | Discharge status: Home / Self Care | Payer: BLUE CROSS/BLUE SHIELD | Source: Ambulatory Visit | Attending: Sports Medicine | Admitting: Sports Medicine

## 2017-09-13 DIAGNOSIS — M25561 Pain in right knee: Secondary | ICD-10-CM

## 2017-09-13 DIAGNOSIS — G8929 Other chronic pain: Secondary | ICD-10-CM

## 2017-09-13 DIAGNOSIS — M1711 Unilateral primary osteoarthritis, right knee: Secondary | ICD-10-CM | POA: Diagnosis not present

## 2017-09-15 ENCOUNTER — Other Ambulatory Visit: Payer: Self-pay | Admitting: Oncology

## 2017-10-15 ENCOUNTER — Observation Stay: Payer: BLUE CROSS/BLUE SHIELD

## 2017-10-15 ENCOUNTER — Other Ambulatory Visit: Payer: Self-pay

## 2017-10-15 ENCOUNTER — Emergency Department: Payer: BLUE CROSS/BLUE SHIELD

## 2017-10-15 ENCOUNTER — Observation Stay
Admission: EM | Admit: 2017-10-15 | Discharge: 2017-10-16 | Disposition: A | Discharge status: Home / Self Care | Payer: BLUE CROSS/BLUE SHIELD | Attending: Internal Medicine | Admitting: Internal Medicine

## 2017-10-15 ENCOUNTER — Encounter: Payer: Self-pay | Admitting: Emergency Medicine

## 2017-10-15 DIAGNOSIS — K219 Gastro-esophageal reflux disease without esophagitis: Secondary | ICD-10-CM | POA: Insufficient documentation

## 2017-10-15 DIAGNOSIS — Z79811 Long term (current) use of aromatase inhibitors: Secondary | ICD-10-CM | POA: Diagnosis not present

## 2017-10-15 DIAGNOSIS — R4182 Altered mental status, unspecified: Secondary | ICD-10-CM

## 2017-10-15 DIAGNOSIS — Z7982 Long term (current) use of aspirin: Secondary | ICD-10-CM | POA: Diagnosis not present

## 2017-10-15 DIAGNOSIS — E785 Hyperlipidemia, unspecified: Secondary | ICD-10-CM | POA: Diagnosis not present

## 2017-10-15 DIAGNOSIS — I10 Essential (primary) hypertension: Secondary | ICD-10-CM | POA: Diagnosis not present

## 2017-10-15 DIAGNOSIS — Z853 Personal history of malignant neoplasm of breast: Secondary | ICD-10-CM | POA: Insufficient documentation

## 2017-10-15 DIAGNOSIS — Z923 Personal history of irradiation: Secondary | ICD-10-CM | POA: Diagnosis not present

## 2017-10-15 DIAGNOSIS — R413 Other amnesia: Secondary | ICD-10-CM | POA: Diagnosis not present

## 2017-10-15 DIAGNOSIS — R51 Headache: Secondary | ICD-10-CM | POA: Diagnosis not present

## 2017-10-15 DIAGNOSIS — G459 Transient cerebral ischemic attack, unspecified: Secondary | ICD-10-CM

## 2017-10-15 DIAGNOSIS — R410 Disorientation, unspecified: Secondary | ICD-10-CM | POA: Diagnosis present

## 2017-10-15 LAB — COMPREHENSIVE METABOLIC PANEL
ALBUMIN: 4.7 g/dL (ref 3.5–5.0)
ALT: 32 U/L (ref 14–54)
AST: 31 U/L (ref 15–41)
Alkaline Phosphatase: 67 U/L (ref 38–126)
Anion gap: 10 (ref 5–15)
BUN: 20 mg/dL (ref 6–20)
CALCIUM: 10.2 mg/dL (ref 8.9–10.3)
CO2: 25 mmol/L (ref 22–32)
CREATININE: 0.73 mg/dL (ref 0.44–1.00)
Chloride: 103 mmol/L (ref 101–111)
GFR calc Af Amer: >60 mL/min (ref >60)
GFR calc non Af Amer: >60 mL/min (ref >60)
Glucose, Bld: 101 mg/dL — ABNORMAL HIGH (ref 65–99)
Potassium: 4.1 mmol/L (ref 3.5–5.1)
SODIUM: 138 mmol/L (ref 135–145)
Total Bilirubin: 0.5 mg/dL (ref 0.3–1.2)
Total Protein: 7.8 g/dL (ref 6.5–8.1)

## 2017-10-15 LAB — URINE DRUG SCREEN, QUALITATIVE (ARMC ONLY)
AMPHETAMINES, UR SCREEN: NOT DETECTED
BARBITURATES, UR SCREEN: NOT DETECTED
Benzodiazepine, Ur Scrn: NOT DETECTED
Cannabinoid 50 Ng, Ur ~~LOC~~: NOT DETECTED
Cocaine Metabolite,Ur ~~LOC~~: NOT DETECTED
MDMA (Ecstasy)Ur Screen: NOT DETECTED
Methadone Scn, Ur: NOT DETECTED
Opiate, Ur Screen: NOT DETECTED
PHENCYCLIDINE (PCP) UR S: NOT DETECTED
Tricyclic, Ur Screen: NOT DETECTED

## 2017-10-15 LAB — URINALYSIS, COMPLETE (UACMP) WITH MICROSCOPIC
Bacteria, UA: NONE SEEN
Bilirubin Urine: NEGATIVE
Glucose, UA: NEGATIVE mg/dL
Hgb urine dipstick: NEGATIVE
KETONES UR: NEGATIVE mg/dL
Leukocytes, UA: NEGATIVE
Nitrite: NEGATIVE
PH: 5 (ref 5.0–8.0)
PROTEIN: NEGATIVE mg/dL
Specific Gravity, Urine: 1.016 (ref 1.005–1.030)

## 2017-10-15 LAB — CBC
HCT: 41.5 % (ref 35.0–47.0)
HEMOGLOBIN: 14.7 g/dL (ref 12.0–16.0)
MCH: 31.7 pg (ref 26.0–34.0)
MCHC: 35.4 g/dL (ref 32.0–36.0)
MCV: 89.7 fL (ref 80.0–100.0)
Platelets: 260 10*3/uL (ref 150–440)
RBC: 4.63 MIL/uL (ref 3.80–5.20)
RDW: 12.9 % (ref 11.5–14.5)
WBC: 8.3 10*3/uL (ref 3.6–11.0)

## 2017-10-15 LAB — ACETAMINOPHEN LEVEL: Acetaminophen (Tylenol), Serum: <10 ug/mL — ABNORMAL LOW (ref 10–30)

## 2017-10-15 LAB — AMMONIA

## 2017-10-15 LAB — TROPONIN I: Troponin I: <0.03 ng/mL (ref <0.03)

## 2017-10-15 LAB — ETHANOL: Alcohol, Ethyl (B): <10 mg/dL (ref <10)

## 2017-10-15 MED ORDER — MORPHINE SULFATE (PF) 4 MG/ML IV SOLN: 4.0000 mg | Freq: Once | INTRAVENOUS | Status: DC

## 2017-10-15 MED ORDER — VITAMIN B-1 100 MG PO TABS
100.0000 mg | ORAL_TABLET | Freq: Every day | ORAL | Status: DC
  Administered 2017-10-16: 10:00:00 100 mg via ORAL
  Filled 2017-10-15: qty 1

## 2017-10-15 MED ORDER — LORATADINE 10 MG PO TABS
10.0000 mg | ORAL_TABLET | Freq: Every day | ORAL | Status: DC
  Administered 2017-10-16: 10 mg via ORAL
  Filled 2017-10-15: qty 1

## 2017-10-15 MED ORDER — LETROZOLE 2.5 MG PO TABS
2.5000 mg | ORAL_TABLET | Freq: Every day | ORAL | Status: DC
  Administered 2017-10-16: 2.5 mg via ORAL
  Filled 2017-10-15 (×2): qty 1

## 2017-10-15 MED ORDER — TOPIRAMATE 25 MG PO TABS
50.0000 mg | ORAL_TABLET | Freq: Two times a day (BID) | ORAL | Status: DC | PRN
  Filled 2017-10-15: qty 2

## 2017-10-15 MED ORDER — PROCHLORPERAZINE EDISYLATE 10 MG/2ML IJ SOLN
10.0000 mg | Freq: Once | INTRAMUSCULAR | Status: AC
  Administered 2017-10-15: 10 mg via INTRAVENOUS
  Filled 2017-10-15: qty 2

## 2017-10-15 MED ORDER — ACETAMINOPHEN 500 MG PO TABS: 1000.0000 mg | ORAL_TABLET | Freq: Four times a day (QID) | ORAL | Status: DC | PRN

## 2017-10-15 MED ORDER — ENOXAPARIN SODIUM 40 MG/0.4ML ~~LOC~~ SOLN
40.0000 mg | SUBCUTANEOUS | Status: DC
  Administered 2017-10-15: 40 mg via SUBCUTANEOUS
  Filled 2017-10-15: qty 0.4

## 2017-10-15 MED ORDER — STROKE: EARLY STAGES OF RECOVERY BOOK
Freq: Once | Status: AC
  Administered 2017-10-15: 22:00:00

## 2017-10-15 MED ORDER — ADULT MULTIVITAMIN W/MINERALS CH
1.0000 | ORAL_TABLET | Freq: Every day | ORAL | Status: DC
  Administered 2017-10-16: 10:00:00 1 via ORAL
  Filled 2017-10-15: qty 1

## 2017-10-15 MED ORDER — SODIUM CHLORIDE 0.9 % IV BOLUS
1000.0000 mL | Freq: Once | INTRAVENOUS | Status: AC
  Administered 2017-10-15: 1000 mL via INTRAVENOUS

## 2017-10-15 MED ORDER — RISAQUAD PO CAPS
1.0000 | ORAL_CAPSULE | Freq: Every day | ORAL | Status: DC
  Administered 2017-10-16: 1 via ORAL
  Filled 2017-10-15: qty 1

## 2017-10-15 MED ORDER — LORAZEPAM 2 MG/ML IJ SOLN: 1.0000 mg | Freq: Four times a day (QID) | INTRAMUSCULAR | Status: DC | PRN

## 2017-10-15 MED ORDER — ACETAMINOPHEN 325 MG PO TABS: 650.0000 mg | ORAL_TABLET | ORAL | Status: DC | PRN

## 2017-10-15 MED ORDER — CLOPIDOGREL BISULFATE 75 MG PO TABS
75.0000 mg | ORAL_TABLET | Freq: Every day | ORAL | Status: DC
  Administered 2017-10-16: 75 mg via ORAL
  Filled 2017-10-15: qty 1

## 2017-10-15 MED ORDER — ATORVASTATIN CALCIUM 20 MG PO TABS: 20.0000 mg | ORAL_TABLET | Freq: Every day | ORAL | Status: DC

## 2017-10-15 MED ORDER — FOLIC ACID 1 MG PO TABS
1.0000 mg | ORAL_TABLET | Freq: Every day | ORAL | Status: DC
  Administered 2017-10-16: 1 mg via ORAL
  Filled 2017-10-15: qty 1

## 2017-10-15 MED ORDER — THIAMINE HCL 100 MG/ML IJ SOLN
100.0000 mg | Freq: Every day | INTRAMUSCULAR | Status: DC
  Filled 2017-10-15 (×2): qty 1

## 2017-10-15 MED ORDER — CALCIUM CARBONATE ANTACID 500 MG PO CHEW: 1.0000 | CHEWABLE_TABLET | ORAL | Status: DC | PRN

## 2017-10-15 MED ORDER — ACETAMINOPHEN 650 MG RE SUPP: 650.0000 mg | RECTAL | Status: DC | PRN

## 2017-10-15 MED ORDER — IPRATROPIUM-ALBUTEROL 0.5-2.5 (3) MG/3ML IN SOLN: 3.0000 mL | Freq: Once | RESPIRATORY_TRACT | Status: DC

## 2017-10-15 MED ORDER — ACETAMINOPHEN 160 MG/5ML PO SOLN
650.0000 mg | ORAL | Status: DC | PRN
  Filled 2017-10-15: qty 20.3

## 2017-10-15 MED ORDER — ONDANSETRON HCL 4 MG/2ML IJ SOLN: 4.0000 mg | Freq: Once | INTRAMUSCULAR | Status: DC

## 2017-10-15 MED ORDER — LORAZEPAM 1 MG PO TABS: 1.0000 mg | ORAL_TABLET | Freq: Four times a day (QID) | ORAL | Status: DC | PRN

## 2017-10-15 MED ORDER — IBUPROFEN 400 MG PO TABS: 400.0000 mg | ORAL_TABLET | Freq: Four times a day (QID) | ORAL | Status: DC | PRN

## 2017-10-15 MED ORDER — HYDRALAZINE HCL 20 MG/ML IJ SOLN: 10.0000 mg | INTRAMUSCULAR | Status: DC | PRN

## 2017-10-15 NOTE — ED Notes (Signed)
Dr. Burlene Arnt wanted this RN to call MRI. No answer.

## 2017-10-15 NOTE — ED Notes (Signed)
Admitting at bedside 

## 2017-10-15 NOTE — ED Notes (Signed)
Dr. McShane at bedside.  

## 2017-10-15 NOTE — H&P (Signed)
[facility] NAME: Jill Reilly    MR#:  [id number]  DATE OF BIRTH:  19-Dec-1955  DATE OF ADMISSION:  10/15/2017  PRIMARY CARE PHYSICIAN: System, Provider Not In   REQUESTING/REFERRING PHYSICIAN:   CHIEF COMPLAINT:   Chief Complaint  Patient presents with  . Altered Mental Status    HISTORY OF PRESENT ILLNESS: Jill Reilly  is a 62 y.o. female with a known history per below, presented to the emergency room with acute altered mental status, confusion with associated headache, transient slurred speech, last known normal around 3:00 today, in the emergency room work-up was unimpressive, CT head negative, blood pressure was noted to be systolically 062I to 948N, ED attending did discuss case with telemetry neurology-recommended admission for TIA work-up, patient evaluated in the emergency room, husband at the bedside, patient with retrograde amnesia, confusion, patient is now being admitted for acute confusion for which possible TIA versus CVA cannot be excluded.  PAST MEDICAL HISTORY:   Past Medical History:  Diagnosis Date  . Anxiety   . Arthritis    FINGERS  . Cancer ([place]) 03/2016   left breast  . GERD (gastroesophageal reflux disease)   . Headache    H/O MIGRAINES  . Personal history of radiation therapy 2017/2018   left breast ca    PAST SURGICAL HISTORY:  Past Surgical History:  Procedure Laterality Date  . BILATERAL CARPAL TUNNEL RELEASE    . BREAST BIOPSY Left 03/31/2016   invasive mammary  . BREAST LUMPECTOMY Left 04/29/2016   invasive mammary carcinoma. Clear margins  . CESAREAN SECTION    . CHOLECYSTECTOMY    . PARTIAL MASTECTOMY WITH NEEDLE LOCALIZATION Left 04/29/2016   Procedure: PARTIAL MASTECTOMY WITH NEEDLE LOCALIZATION;  Surgeon: Leonie Green, MD;  Location: [facility] ORS;  Service: General;  Laterality: Left;  . SENTINEL NODE BIOPSY Left 04/29/2016   Procedure: SENTINEL NODE BIOPSY;  Surgeon: Leonie Green, MD;  Location: [facility] ORS;  Service: General;  Laterality: Left;    SOCIAL HISTORY:  Social History   Tobacco Use  . Smoking status: Never Smoker  . Smokeless tobacco: Never Used  Substance Use Topics  . Alcohol use: Yes    Comment: occasional    FAMILY HISTORY:  Family History  Problem Relation Age of Onset  . Breast cancer Neg Hx     DRUG ALLERGIES: No Known Allergies  REVIEW OF SYSTEMS:   CONSTITUTIONAL: No fever, fatigue or weakness.  EYES: No blurred or double vision.  EARS, NOSE, AND THROAT: No tinnitus or ear pain.  RESPIRATORY: No cough, shortness of breath, wheezing or hemoptysis.  CARDIOVASCULAR: No chest pain, orthopnea, edema.  GASTROINTESTINAL: No nausea, vomiting, diarrhea or abdominal pain.  GENITOURINARY: No dysuria, hematuria.  ENDOCRINE: No polyuria, nocturia,  HEMATOLOGY: No anemia, easy bruising or bleeding SKIN: No rash or lesion. MUSCULOSKELETAL: No joint pain or arthritis.   NEUROLOGIC: No tingling, numbness, weakness. + Confusion, slurred speech, headache PSYCHIATRY: No anxiety or depression.   MEDICATIONS AT HOME:  Prior to Admission medications   Medication Sig Start Date End Date Taking? Authorizing Provider  acetaminophen (TYLENOL) 500 MG tablet Take 1,000 mg by mouth every 6 (six) hours as needed for moderate pain or headache.    [provider]  aspirin EC 81 MG tablet Take 81 mg by mouth daily.    [provider]  Ca Carbonate-Mag Hydroxide (ROLAIDS PO) Take 1 tablet by mouth as needed.    [provider]  cetirizine (ZYRTEC) 10 MG tablet Take 1 tablet by mouth daily as needed.    [provider]  ibuprofen (ADVIL,MOTRIN) 200 MG tablet Take 400 mg by mouth every 6 (six) hours as needed for headache or moderate pain.    [provider]  letrozole (FEMARA) 2.5 MG tablet TAKE ONE TABLET BY MOUTH EVERY DAY 09/15/17   Lloyd Huger, MD  Multiple Vitamin (MULTI-VITAMINS) TABS Take by mouth.     [provider]  Probiotic Product (Amanda) Take 1 capsule by mouth daily.     [provider]      PHYSICAL EXAMINATION:   VITAL SIGNS: Blood pressure (!) 167/96, pulse 83, temperature 98.1 F (36.7 C), resp. rate 16, height 5\' 2"  (1.575 m), weight 81.6 kg (180 lb), SpO2 97 %.  GENERAL:  62 y.o.-year-old patient lying in the bed with no acute distress.  EYES: Pupils equal, round, reactive to light and accommodation. No scleral icterus. Extraocular muscles intact.  HEENT: Head atraumatic, normocephalic. Oropharynx and nasopharynx clear.  NECK:  Supple, no jugular venous distention. No thyroid enlargement, no tenderness.  LUNGS: Normal breath sounds bilaterally, no wheezing, rales,rhonchi or crepitation. No use of accessory muscles of respiration.  CARDIOVASCULAR: S1, S2 normal. No murmurs, rubs, or gallops.  ABDOMEN: Soft, nontender, nondistended. Bowel sounds present. No organomegaly or mass.  EXTREMITIES: No pedal edema, cyanosis, or clubbing.  NEUROLOGIC: Cranial nerves II through XII are intact. Muscle strength 5/5 in all extremities. Sensation intact. Gait not checked.  PSYCHIATRIC: The patient is alert and oriented x 3, confused SKIN: No obvious rash, lesion, or ulcer.   LABORATORY PANEL:   CBC Recent Labs  Lab 10/15/17 1615  WBC 8.3  HGB 14.7  HCT 41.5  PLT 260  MCV 89.7  MCH 31.7  MCHC 35.4  RDW 12.9   ------------------------------------------------------------------------------------------------------------------  Chemistries  Recent Labs  Lab 10/15/17 1615  NA 138  K 4.1  CL 103  CO2 25  GLUCOSE 101*  BUN 20  CREATININE 0.73  CALCIUM 10.2  AST 31  ALT 32  ALKPHOS 67  BILITOT 0.5   ------------------------------------------------------------------------------------------------------------------ estimated creatinine clearance is 71.2 mL/min (by C-G formula based on SCr of 0.73  mg/dL). ------------------------------------------------------------------------------------------------------------------ No results for input(s): TSH, T4TOTAL, T3FREE, THYROIDAB in the last 72 hours.  Invalid input(s): FREET3   Coagulation profile No results for input(s): INR, PROTIME in the last 168 hours. ------------------------------------------------------------------------------------------------------------------- No results for input(s): DDIMER in the last 72 hours. -------------------------------------------------------------------------------------------------------------------  Cardiac Enzymes Recent Labs  Lab 10/15/17 1616  TROPONINI <0.03   ------------------------------------------------------------------------------------------------------------------ Invalid input(s): POCBNP  ---------------------------------------------------------------------------------------------------------------  Urinalysis    Component Value Date/Time   COLORURINE YELLOW (A) 10/15/2017 1640   APPEARANCEUR CLEAR (A) 10/15/2017 1640   LABSPEC 1.016 10/15/2017 1640   PHURINE 5.0 10/15/2017 1640   GLUCOSEU NEGATIVE 10/15/2017 1640   HGBUR NEGATIVE 10/15/2017 1640   BILIRUBINUR NEGATIVE 10/15/2017 1640   KETONESUR NEGATIVE 10/15/2017 1640   PROTEINUR NEGATIVE 10/15/2017 1640   NITRITE NEGATIVE 10/15/2017 1640   LEUKOCYTESUR NEGATIVE 10/15/2017 1640     RADIOLOGY: Ct Head Wo Contrast  Result Date: 10/15/2017 CLINICAL DATA:  Altered mental status. EXAM: CT HEAD WITHOUT CONTRAST TECHNIQUE: Contiguous axial images were obtained from the base of the skull through the vertex without intravenous contrast. COMPARISON:  None. FINDINGS: Brain: No evidence of acute infarction, hemorrhage, hydrocephalus, extra-axial collection or mass lesion/mass effect. Vascular: No hyperdense vessel or unexpected calcification. Skull: Normal. Negative for fracture or focal  lesion. Sinuses/Orbits: No acute  finding. Other: None. IMPRESSION: No acute intracranial abnormality. Electronically Signed   By: Fidela Salisbury M.D.   On: 10/15/2017 16:41    EKG: Orders placed or performed during the hospital encounter of 10/15/17  . EKG 12-Lead  . EKG 12-Lead    IMPRESSION AND PLAN: *Acute confusion with associated retrograde amnesia Admit on our TIA versus CVA protocol, will allow for permissive hypertension, discontinue aspirin, start Plavix, statin therapy-check lipids in the morning, neurology to see, check MRI of the brain/echocardiogram/carotid Dopplers for further evaluation, physical therapy/speech therapy to evaluate/treat, check urine drug screen, RPR, neurochecks per routine  *Acute headache Noted history of migraines-this is different than her usual headache ?  Secondary to acute TIA versus CVA Topamax as needed  *Chronic GERD without esophagitis Stable PPI daily  *Chronic hyperlipidemia, unspecified Statin therapy check lipids in the morning  *Acute hypertension Suspect may be related to possible TIA versus CVA Will allow for permissive hypertension, IV hydralazine as needed systolic blood pressure greater than 180, vitals per routine, make changes as per necessary   All the records are reviewed and case discussed with ED provider. Management plans discussed with the patient, family and they are in agreement.  CODE STATUS:full    TOTAL TIME TAKING CARE OF THIS PATIENT: 45 minutes.    Avel Peace Rik Wadel M.D on 10/15/2017   Between 7am to 6pm - Pager - [phone]  After 6pm go to [email or web address] - password [email or web address] [facility]  Office  [phone]  CC: Primary care physician; System, Provider Not In   Note: This dictation was prepared with Dragon dictation along with smaller phrase technology. Any transcriptional errors that result from this process are unintentional.

## 2017-10-15 NOTE — ED Notes (Signed)
Patient speaking with MRI Tech pre-scan

## 2017-10-15 NOTE — ED Notes (Signed)
MRI tech Jenny Reichmann called and stated patient had anxiety during the exam and the scan was unable to be completed.  I called Casey Burkitt and conveyed this to her.

## 2017-10-15 NOTE — ED Notes (Signed)
Hospitalist at bedside 

## 2017-10-15 NOTE — ED Notes (Signed)
Pt here with husband. Ambulatory around room. Pt is alert, follows commands. No facial droop, arm drift, or leg drift noted. Pt is confused to location and time, otherwise alert. Talking in clear complete sentences. Husband states that pt showered around 3p today and when she came down the stairs noticed she wasn't acting right. States for a few minutes pt had slurred speech and then speech went back to normal. Husband states that symptoms cleared and he went to drive them to a restaurant and noticed that pt became confused again.   Pt denies pain, denies dizziness, denies vision changes, denies sensation loss, denies weakness.

## 2017-10-15 NOTE — ED Notes (Signed)
Pt ambulatory to toilet with steady gait noted.  

## 2017-10-15 NOTE — ED Notes (Signed)
Pt taken to and returned from CT with this RN

## 2017-10-15 NOTE — ED Triage Notes (Addendum)
Pt comes into the ED via [place] with her husband c/o AMS that started today.  The patient's husband noticed that after her shower she became more 'spacey" than normal.  She didn't know exactly where she was or what they were doing.  Patient is alert and oriented to self and place but disoriented to time and situation.  Denies any h/o stroke.  Patient states last known normal was around 15:00.

## 2017-10-15 NOTE — Progress Notes (Signed)
Family Meeting Note  Advance Directive:yes  Today a meeting took place with the Patient, husband.  Patient is able to participate   The following clinical team members were present during this meeting:MD  The following were discussed:Patient's diagnosis: Migraine headache, hyperlipidemia, anxiety, GERD, breast cancer, hypertension, possible stroke, Patient's progosis: Unable to determine and Goals for treatment: Full Code  Additional follow-up to be provided: prn  Time spent during discussion:20 minutes  Gorden Harms, MD

## 2017-10-15 NOTE — ED Notes (Signed)
Patient taken to MRI room 1, escorted by [facility].

## 2017-10-15 NOTE — ED Notes (Signed)
Edd Arbour, ED secretary, paged MRI. This RN spoke to MRI tech on phone who stated he would be here soon.

## 2017-10-15 NOTE — ED Provider Notes (Addendum)
[facility] Emergency Department Provider Note  ____________________________________________   I have reviewed the triage vital signs and the nursing notes. Where available I have reviewed prior notes and, if possible and indicated, outside hospital notes.    HISTORY  Chief Complaint Altered Mental Status    HPI Jill Reilly is a 62 y.o. female history of high cholesterol and fibroids, migraines, endometriosis, drink alcohol, had a "good amount" of alcohol yesterday but seem to be normal this morning and acting normally until around 3:00 this afternoon at which time she came downstairs from a shower, with no documented or noticed trauma by her husband, and was acutely confused.  He said for a few seconds to like she might be slurring her speech but mostly the concern was that she was confused.  She is not slurring her speech at this time.  The patient states she has a slight headache but otherwise has no complaints.  There was no prodrome of seizure, she was acting totally normal all day to the extent that it could be remembered.  Patient herself is giving a very limited history.  Level 5 chart caveat; no further history available due to patient status.  The patient is not sure of things she should still know such as how long she has been married, she does know her address she knows the date she knows where she is, she does not have good recollection however events leading up to the time when she became confused.  She denies any drug abuse, she was not drinking today she states, there is no witness or recollected trauma, nothing seems to be injured, there is no seizure activity noted there is no incontinence of bowel or bladder and she did not bite her tongue.  Speech is normal at this time and there is no complaint of focal numbness or weakness.   Past Medical History:  Diagnosis Date  . Anxiety   . Arthritis    FINGERS  . Cancer ([place]) 03/2016   left breast  .  GERD (gastroesophageal reflux disease)   . Headache    H/O MIGRAINES  . Personal history of radiation therapy 2017/2018   left breast ca    Patient Active Problem List   Diagnosis Date Noted  . Primary cancer of upper outer quadrant of left female breast ([place]) 04/10/2016    Past Surgical History:  Procedure Laterality Date  . BILATERAL CARPAL TUNNEL RELEASE    . BREAST BIOPSY Left 03/31/2016   invasive mammary  . BREAST LUMPECTOMY Left 04/29/2016   invasive mammary carcinoma. Clear margins  . CESAREAN SECTION    . CHOLECYSTECTOMY    . PARTIAL MASTECTOMY WITH NEEDLE LOCALIZATION Left 04/29/2016   Procedure: PARTIAL MASTECTOMY WITH NEEDLE LOCALIZATION;  Surgeon: Leonie Green, MD;  Location: [facility] ORS;  Service: General;  Laterality: Left;  . SENTINEL NODE BIOPSY Left 04/29/2016   Procedure: SENTINEL NODE BIOPSY;  Surgeon: Leonie Green, MD;  Location: [facility] ORS;  Service: General;  Laterality: Left;    Prior to Admission medications   Medication Sig Start Date End Date Taking? Authorizing Provider  acetaminophen (TYLENOL) 500 MG tablet Take 1,000 mg by mouth every 6 (six) hours as needed for moderate pain or headache.    [provider]  aspirin EC 81 MG tablet Take 81 mg by mouth daily.    [provider]  Ca Carbonate-Mag Hydroxide (ROLAIDS PO) Take 1 tablet by mouth as needed.    [provider]  cetirizine (ZYRTEC) 10 MG tablet Take 1 tablet by mouth daily as needed.    [provider]  ibuprofen (ADVIL,MOTRIN) 200 MG tablet Take 400 mg by mouth every 6 (six) hours as needed for headache or moderate pain.    [provider]  letrozole (FEMARA) 2.5 MG tablet TAKE ONE TABLET BY MOUTH EVERY DAY 09/15/17   Lloyd Huger, MD  Multiple Vitamin (MULTI-VITAMINS) TABS Take by mouth.    [provider]  Probiotic Product (Katherine) Take 1 capsule by mouth daily.     [provider]     Allergies Patient has no known allergies.  Family History  Problem Relation Age of Onset  . Breast cancer Neg Hx     Social History Social History   Tobacco Use  . Smoking status: Never Smoker  . Smokeless tobacco: Never Used  Substance Use Topics  . Alcohol use: Yes    Comment: occasional  . Drug use: No    Review of Systems Constitutional: No fever/chills Eyes: No visual changes. ENT: No sore throat. No stiff neck no neck pain Cardiovascular: Denies chest pain. Respiratory: Denies shortness of breath. Gastrointestinal:   no vomiting.  No diarrhea.  No constipation. Genitourinary: Negative for dysuria. Musculoskeletal: Negative lower extremity swelling Skin: Negative for rash. Neurological: Negative for severe headaches, focal weakness or numbness.   ____________________________________________   PHYSICAL EXAM:  VITAL SIGNS: ED Triage Vitals  Enc Vitals Group     BP 10/15/17 1603 (!) 190/104     Pulse Rate 10/15/17 1603 86     Resp 10/15/17 1603 20     Temp 10/15/17 1603 97.8 F (36.6 C)     Temp Source 10/15/17 1603 Oral     SpO2 10/15/17 1603 96 %     Weight 10/15/17 1604 180 lb (81.6 kg)     Height 10/15/17 1604 5\' 2"  (1.575 m)     Head Circumference --      Peak Flow --      Pain Score 10/15/17 1609 0     Pain Loc --      Pain Edu? --      Excl. in [place]? --     Constitutional: Patient is alert, and seems oriented but when you asked specific questions about the course of the day she looks to her husband for help, when you ask her what her new medications are she looks to her husband for help and when you ask her how long she is in marriage, so many orientation questions some of which are more long-term she seems to be confused by.  She knows where she is, she is not exactly sure of the date.  She knows her name, she knows her address but she does remember things from when she was going up how many siblings she has name of her dog etc. Eyes:  Conjunctivae are normal Head: Atraumatic HEENT: No congestion/rhinnorhea. Mucous membranes are moist.  Oropharynx non-erythematous Neck:   Nontender with no meningismus, no masses, no stridor Cardiovascular: Normal rate, regular rhythm. Grossly normal heart sounds.  Good peripheral circulation. Respiratory: Normal respiratory effort.  No retractions. Lungs CTAB. Abdominal: Soft and nontender. No distention. No guarding no rebound Back:  There is no focal tenderness or step off.  there is no midline tenderness there are no lesions noted. there is no CVA tenderness  Musculoskeletal: No lower extremity tenderness, no upper extremity tenderness. No joint effusions, no DVT signs strong distal pulses no  edema Neurologic: Cranial nerves II through XII are grossly intact 5 out of 5 strength bilateral upper and lower extremity. Finger to nose within normal limits heel to shin within normal limits, speech is normal with no word finding difficulty or dysarthria, reflexes symmetric, pupils are equally round and reactive to light, there is no pronator drift, sensation is normal, vision is intact to confrontation, gait is deferred, there is no nystagmus, normal neurologic exam aside from some baseline confusion Skin:  Skin is warm, dry and intact. No rash noted. Psychiatric: Mood and affect are she is, and a little tearful. Speech and behavior are normal.  ____________________________________________   LABS (all labs ordered are listed, but only abnormal results are displayed)  Labs Reviewed  COMPREHENSIVE METABOLIC PANEL - Abnormal; Notable for the following components:      Result Value   Glucose, Bld 101 (*)    All other components within normal limits  CBC  AMMONIA  URINALYSIS, COMPLETE (UACMP) WITH MICROSCOPIC  URINE DRUG SCREEN, QUALITATIVE ([facility] ONLY)  ACETAMINOPHEN LEVEL  ETHANOL  TROPONIN I  CBG MONITORING, ED    Pertinent labs  results that were available during my care of the patient  were reviewed by me and considered in my medical decision making (see chart for details). ____________________________________________  EKG  I personally interpreted any EKGs ordered by me or triage  ____________________________________________  RADIOLOGY  Pertinent labs & imaging results that were available during my care of the patient were reviewed by me and considered in my medical decision making (see chart for details). If possible, patient and/or family made aware of any abnormal findings.  No results found. ____________________________________________    PROCEDURES  Procedure(s) performed: None  Procedures  Critical Care performed: None  ____________________________________________   INITIAL IMPRESSION / ASSESSMENT AND PLAN / ED COURSE  Pertinent labs & imaging results that were available during my care of the patient were reviewed by me and considered in my medical decision making (see chart for details).  Patient here with confusion that began around 3 PM.  Very concerning history, no focal deficits to suggest CVA, I did take her personally to CT scan which is is pending.  She is afebrile, blood pressure somewhat up but she is somewhat anxious.  Unclear if this is protruding.  She does not have elevated core temperature and this is a very hot day they have not been outside of the leg on her very well-functioning air conditioning, heat exhaustion could certainly be a possibility but does not seem likely as they were mostly inside today.  Differential also includes embolic encephalopathy, mass, stroke, among other things.  Little suspicion for acute meningitis given no fever no meningismus normal white count.  We will check extensive work.  Blood sugar is normal.  Vital signs are reassuring aside from hypertension which could be contributed to by her anxiety about her condition but also could bring about her condition if she is having press for example.  Given no focality to  her exam and generalized confusion being her only real complaint at this time, I do not think should be good candidate for TPA.  ----------------------------------------- 5:00 PM on 10/15/2017 -----------------------------------------  Blood pressure is trending down is in the 170s now, she is more relaxed a little bit.  She is still confused.  She is tells me that she has had memory issues with migraine in the past although her husband is not sure if this is true, she is also had migraine with  associated neurologic symptoms such as RN tingling which she states she is not having at this moment.  She remains with a neurologic exam that is completely normal aside from her confusion, we have paged neurology, CT scan is looked at by me and is negative, we will discuss further imaging and other options.  Given her grain and the fact that she has a headache which now feels like a grain, not worst headache of life however, gradual in onset, we will also give her anti-migraine medication.  Patient has a diffuse encephalopathic picture which is not consistent with a small head bleed especially given the fact that she was here within an hour and a half of onset of symptoms and her CT scan is completely normal, very low suspicion that this is a bleed.  We will see what input neurology can provide  ----------------------------------------- 5:13 PM on 10/15/2017 -----------------------------------------   Discussed with Dr. Irish Elders I do appreciate the consult.  He thinks the patient requires MRI MRA and admission.  He does not feel LP is warranted, with a blood pressure currently in the 160s he does not feel any acute hypertension intervention is indicated.  He does agree with Compazine for migraine symptoms.  He does feel that the patient should be admitted to the hospital for at least observation.    He does agree that the patient should not have TPA.  We discussed her physical findings, history, serial  neurologic exams which have been performing, vital signs, blood work, CT etc.  We will order MRI MRA of the head, and we will discuss with hospitalist service.  ----------------------------------------- 5:28 PM on 10/15/2017 -----------------------------------------  Discussed with Dr. Jerelyn Charles, who admit the patient for further aggressive work-up of this confused state. I also discussed LP with admitting phsycian who also feels it is not warranted. It is not a procedure without risks and given absence of infectious symptoms will defer it.     ____________________________________________   FINAL CLINICAL IMPRESSION(S) / ED DIAGNOSES  Final diagnoses:  None      This chart was dictated using voice recognition software.  Despite best efforts to proofread,  errors can occur which can change meaning.      Schuyler Amor, MD 10/15/17 1648    Schuyler Amor, MD 10/15/17 1714    Schuyler Amor, MD 10/15/17 1729    Schuyler Amor, MD 10/15/17 [phone]

## 2017-10-15 NOTE — ED Notes (Signed)
Pt ambulatory to toilet

## 2017-10-16 ENCOUNTER — Observation Stay: Payer: BLUE CROSS/BLUE SHIELD

## 2017-10-16 ENCOUNTER — Observation Stay (HOSPITAL_BASED_OUTPATIENT_CLINIC_OR_DEPARTMENT_OTHER)
Admit: 2017-10-16 | Discharge: 2017-10-16 | Disposition: A | Discharge status: Home / Self Care | Payer: BLUE CROSS/BLUE SHIELD | Attending: Family Medicine | Admitting: Family Medicine

## 2017-10-16 DIAGNOSIS — G459 Transient cerebral ischemic attack, unspecified: Secondary | ICD-10-CM | POA: Diagnosis not present

## 2017-10-16 DIAGNOSIS — I340 Nonrheumatic mitral (valve) insufficiency: Secondary | ICD-10-CM

## 2017-10-16 LAB — LIPID PANEL
CHOLESTEROL: 130 mg/dL (ref 0–200)
HDL: 45 mg/dL (ref >40)
LDL CALC: 38 mg/dL (ref 0–99)
Total CHOL/HDL Ratio: 2.9 RATIO
Triglycerides: 234 mg/dL — ABNORMAL HIGH (ref <150)
VLDL: 47 mg/dL — AB (ref 0–40)

## 2017-10-16 LAB — HEMOGLOBIN A1C
Hgb A1c MFr Bld: 5.5 % (ref 4.8–5.6)
Mean Plasma Glucose: 111.15 mg/dL

## 2017-10-16 LAB — ECHOCARDIOGRAM COMPLETE
HEIGHTINCHES: 62 in
Weight: 2921.6 oz

## 2017-10-16 MED ORDER — ASPIRIN EC 81 MG PO TBEC
81.0000 mg | DELAYED_RELEASE_TABLET | Freq: Every day | ORAL | Status: DC
  Administered 2017-10-16: 81 mg via ORAL
  Filled 2017-10-16: qty 1

## 2017-10-16 MED ORDER — DEXAMETHASONE SODIUM PHOSPHATE 10 MG/ML IJ SOLN
10.0000 mg | Freq: Once | INTRAMUSCULAR | Status: AC
  Administered 2017-10-16: 12:00:00 10 mg via INTRAVENOUS
  Filled 2017-10-16: qty 1

## 2017-10-16 NOTE — Progress Notes (Deleted)
Family Meeting Note  Advance Directive:yes  Today a meeting took place with the  Daughter in the room Patient unable to participate due to Dementia The following were discussed:Patient's diagnosis:  Pt admitted with MSSA sepsis suspected due to Patient's progosis:critical but stable. D/w dter Ms Matthew Saras and pt is DNR per pt's wishes  Will consider Palliative care if need be  Time spent during discussion 16 mins Fritzi Mandes, MD

## 2017-10-16 NOTE — Progress Notes (Signed)
PT Cancellation Note  Patient Details Name: Jill Reilly MRN: [id number] DOB: [birth year]   Cancelled Treatment:    Reason Eval/Treat Not Completed: PT screened, no needs identified, will sign off.  PT consult received.  Chart reviewed.  Pt seen for PT screen [phone].  Pt independent with bed mobility, transfers, ambulation 140 feet (no AD), and modified independent navigating 3 stairs with R railing.  No loss of balance with ambulation and head turns R/L/up/down, and turning 180 degrees and stopping.  Intact B LE sensation, strength, tone, coordination, and proprioception.  Imaging 09/14/17 showing acute or subacute nondisplaced subchondral fx periphery of WB'ing lateral femoral condyle on R LE: pt reporting that she has a brace at home but did not need to wear it during therapy (and does not usually wear it at home) and reporting no pain during session (except 5/10 headache).  CM notified of PT screen.  Leitha Bleak, PT 10/16/17, 11:32 AM [phone]

## 2017-10-16 NOTE — Progress Notes (Signed)
OT Cancellation Note  Patient Details Name: Jill Reilly MRN: [id number] DOB: December 08, 1955   Cancelled Treatment:     OT screen completed with pt and husband at bedside. Pt completing functional mobility and ADLs independently. Independent at baseline. Husband reports he is staying home from work for a couple days to ensure safety when pt returns home. Pt reports no pain, stating headache has subsided. Strength and ROM of BUE is WFL. Pt reports pain in R shoulder, at baseline, requesting no MMT completed on that arm. Stated she never followed up with a physician but noticed decreased function many months ago, encouraged follow up in case of possible injury at baseline. Pt awaiting MRI, if any changes OT may be reconsulted. OT will sign off this date, no further needs noted.   Zenovia Jarred, MSOT, OTR/L   [place] 10/16/2017, 1:29 PM

## 2017-10-16 NOTE — Progress Notes (Signed)
[facility] [place] NAME: Jill Reilly    MR#:  [id number]  DATE OF BIRTH:  May 04, 1956  SUBJECTIVE:   It with confusion and headache. Patient does not remember most of the events from yesterday. She is more clear and alert oriented today. Husband confirms that. No focal weakness. REVIEW OF SYSTEMS:   Review of Systems  Constitutional: Negative for chills, fever and weight loss.  HENT: Negative for ear discharge, ear pain and nosebleeds.   Eyes: Negative for blurred vision, pain and discharge.  Respiratory: Negative for sputum production, shortness of breath, wheezing and stridor.   Cardiovascular: Negative for chest pain, palpitations, orthopnea and PND.  Gastrointestinal: Negative for abdominal pain, diarrhea, nausea and vomiting.  Genitourinary: Negative for frequency and urgency.  Musculoskeletal: Negative for back pain and joint pain.  Neurological: Positive for headaches. Negative for sensory change, speech change, focal weakness and weakness.  Psychiatric/Behavioral: Negative for depression and hallucinations. The patient is not nervous/anxious.    Tolerating Diet:yes Tolerating PT: ambulatory  DRUG ALLERGIES:  No Known Allergies  VITALS:  Blood pressure (!) 149/83, pulse 78, temperature (!) 97.5 F (36.4 C), temperature source Oral, resp. rate 16, height 5\' 2"  (1.575 m), weight 82.8 kg (182 lb 9.6 oz), SpO2 96 %.  PHYSICAL EXAMINATION:   Physical Exam  GENERAL:  62 y.o.-year-old patient lying in the bed with no acute distress.  EYES: Pupils equal, round, reactive to light and accommodation. No scleral icterus. Extraocular muscles intact.  HEENT: Head atraumatic, normocephalic. Oropharynx and nasopharynx clear.  NECK:  Supple, no jugular venous distention. No thyroid enlargement, no tenderness.  LUNGS: Normal breath sounds bilaterally, no wheezing, rales, rhonchi. No use of accessory muscles of respiration.   CARDIOVASCULAR: S1, S2 normal. No murmurs, rubs, or gallops.  ABDOMEN: Soft, nontender, nondistended. Bowel sounds present. No organomegaly or mass.  EXTREMITIES: No cyanosis, clubbing or edema b/l.    NEUROLOGIC: Cranial nerves II through XII are intact. No focal Motor or sensory deficits b/l.   PSYCHIATRIC:  patient is alert and oriented x 3.  SKIN: No obvious rash, lesion, or ulcer.   LABORATORY PANEL:  CBC Recent Labs  Lab 10/15/17 1615  WBC 8.3  HGB 14.7  HCT 41.5  PLT 260    Chemistries  Recent Labs  Lab 10/15/17 1615  NA 138  K 4.1  CL 103  CO2 25  GLUCOSE 101*  BUN 20  CREATININE 0.73  CALCIUM 10.2  AST 31  ALT 32  ALKPHOS 67  BILITOT 0.5   Cardiac Enzymes Recent Labs  Lab 10/15/17 1616  TROPONINI <0.03   RADIOLOGY:  Ct Head Wo Contrast  Result Date: 10/15/2017 CLINICAL DATA:  Altered mental status. EXAM: CT HEAD WITHOUT CONTRAST TECHNIQUE: Contiguous axial images were obtained from the base of the skull through the vertex without intravenous contrast. COMPARISON:  None. FINDINGS: Brain: No evidence of acute infarction, hemorrhage, hydrocephalus, extra-axial collection or mass lesion/mass effect. Vascular: No hyperdense vessel or unexpected calcification. Skull: Normal. Negative for fracture or focal lesion. Sinuses/Orbits: No acute finding. Other: None. IMPRESSION: No acute intracranial abnormality. Electronically Signed   By: Fidela Salisbury M.D.   On: 10/15/2017 16:41   US Carotid Bilateral (at [facility] Only)  Result Date: 10/16/2017 CLINICAL DATA:  TIA. Altered mental status. Slurred speech. Headache. EXAM: BILATERAL CAROTID DUPLEX ULTRASOUND TECHNIQUE: Pearline Cables scale imaging, color Doppler and duplex ultrasound were performed of bilateral carotid and vertebral arteries in the neck.  COMPARISON:  None. FINDINGS: Criteria: Quantification of carotid stenosis is based on velocity parameters that correlate the residual internal carotid diameter with  NASCET-based stenosis levels, using the diameter of the distal internal carotid lumen as the denominator for stenosis measurement. The following velocity measurements were obtained: RIGHT ICA:  50 cm/sec CCA:  83 cm/sec SYSTOLIC ICA/CCA RATIO:  0.6 DIASTOLIC ICA/CCA RATIO: ECA:  61 cm/sec LEFT ICA:  87 cm/sec CCA:  70 cm/sec SYSTOLIC ICA/CCA RATIO:  1.2 DIASTOLIC ICA/CCA RATIO: ECA:  63 cm/sec RIGHT CAROTID ARTERY: Mild mixed plaque in the bulb. Low resistance internal carotid Doppler pattern is preserved. RIGHT VERTEBRAL ARTERY:  Antegrade. LEFT CAROTID ARTERY: Little if any plaque in the bulb. Low resistance internal carotid Doppler pattern is preserved. LEFT VERTEBRAL ARTERY:  Antegrade. IMPRESSION: Less than 50% stenosis in the right and left internal carotid arteries. Electronically Signed   By: Marybelle Killings M.D.   On: 10/16/2017 10:41   ASSESSMENT AND PLAN:   Jill Reilly  is a 62 y.o. female with a known history per below, presented to the emergency room with acute altered mental status, confusion with associated headache, transient slurred speech, last known normal around 3:00 today, in the emergency room work-up was unimpressive, CT head negative, blood pressure was noted to be systolically 222L to 798X  *Acute confusion with associated retrograde amnesia -appears TIA versus complicated migraine. CT had negative. Ultrasound carotid Doppler no significant stenosis. Echo looks okay. MRI pending. -Continue aspirin daily. Seen by neurology.  *Acute on chronic headache Noted history of migraines-this is different than her usual headache ?  Secondary to acute TIA versus CVA -as needed pain meds. Patient recommended to follow up headache clinic if gets frequent episodes  *Chronic GERD without esophagitis PPI daily  *Chronic hyperlipidemia, unspecified Statin therapy check lipids in the morning  *due to blood pressure with her diagnosis of hypertension  -suspect may be related to  headache/pain  -will allow for permissive hypertension, IV hydralazine as needed systolic blood pressure greater than 180, vitals per routine, make changes as per necessary -overall blood pressure stable. Will not start her on any chronic blood pressure medication. Patient advised to keep log of blood pressure at home and discussed with primary care physician.    Case discussed with Care Management/Social Worker. Management plans discussed with the patient, family and they are in agreement.  CODE STATUS: full  DVT Prophylaxis: Lovenox TOTAL TIME TAKING CARE OF THIS PATIENT: *35* minutes.  >50% time spent on counselling and coordination of care  POSSIBLE D/C IN 1 to 2 DAYS, DEPENDING ON CLINICAL CONDITION.  Note: This dictation was prepared with Dragon dictation along with smaller phrase technology. Any transcriptional errors that result from this process are unintentional.  Fritzi Mandes M.D on 10/16/2017 at 2:54 PM  Between 7am to 6pm - Pager - [phone]  After 6pm go to [email or web address] - password [email or web address]  [facility]  Office  [phone]  CC: Primary care physician; System, Provider Not InPatient ID: Jill Reilly, female   DOB: 1955/12/13, 62 y.o.   MRN: [id number]

## 2017-10-16 NOTE — Plan of Care (Signed)
  Problem: Education: Goal: Knowledge of disease or condition will improve Outcome: Progressing Goal: Knowledge of secondary prevention will improve Outcome: Progressing Goal: Knowledge of patient specific risk factors addressed and post discharge goals established will improve Outcome: Progressing   Problem: Coping: Goal: Will verbalize positive feelings about self Outcome: Progressing   Problem: Ischemic Stroke/TIA Tissue Perfusion: Goal: Complications of ischemic stroke/TIA will be minimized Outcome: Progressing   Problem: Health Behavior/Discharge Planning: Goal: Ability to manage health-related needs will improve Outcome: Progressing   Problem: Clinical Measurements: Goal: Ability to maintain clinical measurements within normal limits will improve Outcome: Progressing Goal: Will remain free from infection Outcome: Progressing Goal: Diagnostic test results will improve Outcome: Progressing Goal: Respiratory complications will improve Outcome: Progressing Goal: Cardiovascular complication will be avoided Outcome: Progressing

## 2017-10-16 NOTE — Progress Notes (Signed)
SLP Cancellation Note  Patient Details Name: Jill Reilly MRN: [id number] DOB: 01-04-1956   Cancelled treatment:       Reason Eval/Treat Not Completed: Patient at procedure or test/unavailable(chart reviewed; consulted NSG ). NSG reported pt is conversive and following all commands. Pt having a test currently; scheduled for MRI today. Due to pt's and husband's report of "He said for a few seconds to like she might be slurring her speech but mostly the concern was that she was confused.  She is not slurring her speech at this time. The patient states she has a slight headache but otherwise has no complaints." and that this seemed to occur briefly again later when attempting to go out to eat along w/ recalling the events, will await the results of the MRI, tests and then f/u w/ Cognitive evaluation. NSG updated.     Orinda Kenner, MS, CCC-SLP [facility] 10/16/2017, 9:03 AM

## 2017-10-16 NOTE — Discharge Instructions (Signed)
Follow up with primary care physician

## 2017-10-16 NOTE — Progress Notes (Signed)
Discharge instructions given and went over with patient and patients husband at bedside. All questions answered. Patient discharged home with husband via wheelchair by nursing staff. Madlyn Frankel, RN

## 2017-10-16 NOTE — Consult Note (Signed)
Referring Physician: Dr. Posey Pronto     Chief Complaint: HA and slurry speech   HPI: Jill Reilly is an 62 y.o. female with a known history of headaches, anxiety  presented to the emergency room with acute confusion with associated headache, transient slurred speech, last known normal around 3:00PM yesterday. Pt was hypertensive on admission with blood pressure was noted to be systolically 950D to 326Z. Now much improved only complaint of slight headache    Date last known well: Date: 10/15/2017 Time last known well: Time: 03:00 tPA Given: No: resolved symptoms   Past Medical History:  Diagnosis Date  . Anxiety   . Arthritis    FINGERS  . Cancer ([place]) 03/2016   left breast  . GERD (gastroesophageal reflux disease)   . Headache    H/O MIGRAINES  . Personal history of radiation therapy 2017/2018   left breast ca    Past Surgical History:  Procedure Laterality Date  . BILATERAL CARPAL TUNNEL RELEASE    . BREAST BIOPSY Left 03/31/2016   invasive mammary  . BREAST LUMPECTOMY Left 04/29/2016   invasive mammary carcinoma. Clear margins  . CESAREAN SECTION    . CHOLECYSTECTOMY    . PARTIAL MASTECTOMY WITH NEEDLE LOCALIZATION Left 04/29/2016   Procedure: PARTIAL MASTECTOMY WITH NEEDLE LOCALIZATION;  Surgeon: Leonie Green, MD;  Location: [facility] ORS;  Service: General;  Laterality: Left;  . SENTINEL NODE BIOPSY Left 04/29/2016   Procedure: SENTINEL NODE BIOPSY;  Surgeon: Leonie Green, MD;  Location: [facility] ORS;  Service: General;  Laterality: Left;    Family History  Problem Relation Age of Onset  . Breast cancer Neg Hx    Social History:  reports that she has never smoked. She has never used smokeless tobacco. She reports that she drinks alcohol. She reports that she does not use drugs.  Allergies: No Known Allergies  Medications: I have reviewed the patient's current medications.  ROS: History obtained from the patient  General ROS: negative for - chills, fatigue,  fever, night sweats, weight gain or weight loss Psychological ROS: hx of anxiety Ophthalmic ROS: negative for - blurry vision, double vision, eye pain or loss of vision ENT ROS: negative for - epistaxis, nasal discharge, oral lesions, sore throat, tinnitus or vertigo Allergy and Immunology ROS: negative for - hives or itchy/watery eyes Hematological and Lymphatic ROS: negative for - bleeding problems, bruising or swollen lymph nodes Endocrine ROS: negative for - galactorrhea, hair pattern changes, polydipsia/polyuria or temperature intolerance Respiratory ROS: negative for - cough, hemoptysis, shortness of breath or wheezing Cardiovascular ROS: negative for - chest pain, dyspnea on exertion, edema or irregular heartbeat Gastrointestinal ROS: negative for - abdominal pain, diarrhea, hematemesis, nausea/vomiting or stool incontinence Genito-Urinary ROS: negative for - dysuria, hematuria, incontinence or urinary frequency/urgency Musculoskeletal ROS: negative for - joint swelling or muscular weakness Neurological ROS: as noted in HPI Dermatological ROS: negative for rash and skin lesion changes  Physical Examination: Blood pressure 135/67, pulse 70, temperature 98.3 F (36.8 C), temperature source Oral, resp. rate 13, height 5\' 2"  (1.575 m), weight 182 lb 9.6 oz (82.8 kg), SpO2 98 %.  HEENT-  Normocephalic, no lesions, without obvious abnormality.  Normal external eye and conjunctiva.  Normal TM's bilaterally.  Normal auditory canals and external ears. Normal external nose, mucus membranes and septum.  Normal pharynx. Cardiovascular- regular rate and rhythm, S1, S2 normal, no murmur, click, rub or gallop, pulses palpable throughout   Lungs- Heart exam - S1, S2 normal, no murmur,  no gallop, rate regular Abdomen- soft, non-tender; bowel sounds normal; no masses,  no organomegaly Extremities- less then 2 second capillary refill Lymph-no adenopathy palpable Musculoskeletal-no joint tenderness,  deformity or swelling Skin-warm and dry, no hyperpigmentation, vitiligo, or suspicious lesions  Neurological Examination   Mental Status: Alert, oriented, thought content appropriate.  Speech fluent without evidence of aphasia.  Able to follow 3 step commands without difficulty. Cranial Nerves: II: Discs flat bilaterally; Visual fields grossly normal, pupils equal, round, reactive to light and accommodation III,IV, VI: ptosis not present, extra-ocular motions intact bilaterally V,VII: smile symmetric, facial light touch sensation normal bilaterally VIII: hearing normal bilaterally IX,X: gag reflex present XI: bilateral shoulder shrug XII: midline tongue extension Motor: Right : Upper extremity   5/5    Left:     Upper extremity   5/5  Lower extremity   5/5     Lower extremity   5/5 Tone and bulk:normal tone throughout; no atrophy noted Sensory: Pinprick and light touch intact throughout, bilaterally Deep Tendon Reflexes: 2+ and symmetric throughout Plantars: Right: downgoing   Left: downgoing Cerebellar: normal finger-to-nose, normal rapid alternating movements and normal heel-to-shin test Gait: not tested       Laboratory Studies:  Basic Metabolic Panel: Recent Labs  Lab 10/15/17 1615  NA 138  K 4.1  CL 103  CO2 25  GLUCOSE 101*  BUN 20  CREATININE 0.73  CALCIUM 10.2    Liver Function Tests: Recent Labs  Lab 10/15/17 1615  AST 31  ALT 32  ALKPHOS 67  BILITOT 0.5  PROT 7.8  ALBUMIN 4.7   No results for input(s): LIPASE, AMYLASE in the last 168 hours. Recent Labs  Lab 10/15/17 1640  AMMONIA <9*    CBC: Recent Labs  Lab 10/15/17 1615  WBC 8.3  HGB 14.7  HCT 41.5  MCV 89.7  PLT 260    Cardiac Enzymes: Recent Labs  Lab 10/15/17 1616  TROPONINI <0.03    BNP: Invalid input(s): POCBNP  CBG: No results for input(s): GLUCAP in the last 168 hours.  Microbiology: No results found for this or any previous visit.  Coagulation Studies: No  results for input(s): LABPROT, INR in the last 72 hours.  Urinalysis:  Recent Labs  Lab 10/15/17 1640  COLORURINE YELLOW*  LABSPEC 1.016  PHURINE 5.0  GLUCOSEU NEGATIVE  HGBUR NEGATIVE  BILIRUBINUR NEGATIVE  KETONESUR NEGATIVE  PROTEINUR NEGATIVE  NITRITE NEGATIVE  LEUKOCYTESUR NEGATIVE    Lipid Panel:    Component Value Date/Time   CHOL 130 10/16/2017 0412   TRIG 234 (H) 10/16/2017 0412   HDL 45 10/16/2017 0412   CHOLHDL 2.9 10/16/2017 0412   VLDL 47 (H) 10/16/2017 0412   LDLCALC 38 10/16/2017 0412    HgbA1C: No results found for: HGBA1C  Urine Drug Screen:      Component Value Date/Time   LABOPIA NONE DETECTED 10/15/2017 1640   COCAINSCRNUR NONE DETECTED 10/15/2017 1640   LABBENZ NONE DETECTED 10/15/2017 1640   AMPHETMU NONE DETECTED 10/15/2017 1640   THCU NONE DETECTED 10/15/2017 1640   LABBARB NONE DETECTED 10/15/2017 1640    Alcohol Level:  Recent Labs  Lab 10/15/17 1616  ETH <10    Other results: EKG: normal EKG, normal sinus rhythm, unchanged from previous tracings.  Imaging: Ct Head Wo Contrast  Result Date: 10/15/2017 CLINICAL DATA:  Altered mental status. EXAM: CT HEAD WITHOUT CONTRAST TECHNIQUE: Contiguous axial images were obtained from the base of the skull through the vertex without intravenous contrast. COMPARISON:  None. FINDINGS: Brain: No evidence of acute infarction, hemorrhage, hydrocephalus, extra-axial collection or mass lesion/mass effect. Vascular: No hyperdense vessel or unexpected calcification. Skull: Normal. Negative for fracture or focal lesion. Sinuses/Orbits: No acute finding. Other: None. IMPRESSION: No acute intracranial abnormality. Electronically Signed   By: Fidela Salisbury M.D.   On: 10/15/2017 16:41    Assessment: 62 y.o. female  with a known history of headaches, anxiety  presented to the emergency room with acute confusion with associated headache, transient slurred speech, last known normal around 3:00PM yesterday.  Pt was hypertensive on admission with blood pressure was noted to be systolically 456Y to 563S. Now much improved only complaint of slight headache   Stroke Risk Factors - hypertension  Plan: 1. HgbA1c, fasting lipid panel 2. MRI, MRA  of the brain without contrast 3. PT consult, OT consult, Speech consult 4. Echocardiogram 5. Carotid dopplers 6. Prophylactic therapy-Antiplatelet med: Aspirin - dose 81 7. NPO until RN stroke swallow screen 8. Telemetry monitoring 9. Frequent neuro checks 10. Possibly transient global amnesia vs TIA in setting of HA 11. If no abnormalities on MRI then d/c planning     10/16/2017, 9:58 AM

## 2017-10-17 LAB — RPR: RPR Ser Ql: NONREACTIVE

## 2017-10-17 LAB — HIV ANTIBODY (ROUTINE TESTING W REFLEX): HIV SCREEN 4TH GENERATION: NONREACTIVE

## 2017-10-30 NOTE — Discharge Summary (Signed)
[facility] NAME: Jill Reilly    MR#:  [id number]  DATE OF BIRTH:  May 14, 1956  DATE OF ADMISSION:  10/15/2017 ADMITTING PHYSICIAN: Gorden Harms, MD  DATE OF DISCHARGE:5/27/  PRIMARY CARE PHYSICIAN: System, Provider Not In    ADMISSION DIAGNOSIS:  Altered mental status, unspecified altered mental status type [R41.82]  DISCHARGE DIAGNOSIS:  Headache with temporary amnesia  SECONDARY DIAGNOSIS:   Past Medical History:  Diagnosis Date  . Anxiety   . Arthritis    FINGERS  . Cancer ([place]) 03/2016   left breast  . GERD (gastroesophageal reflux disease)   . Headache    H/O MIGRAINES  . Personal history of radiation therapy 2017/2018   left breast ca    HOSPITAL COURSE:  MaryDibellois a61 y.o.femalewith a known history per below, presented to the emergency room with acute altered mental status, confusion with associated headache, transient slurred speech, last known normal around 3:00 today, in the emergency room work-up was unimpressive, CT head negative, blood pressure was noted to be systolically 009Q to 330Q  *Acute confusion with associated retrograde amnesia -appears TIA versus complicated migraine. CT had negative. Ultrasound carotid Doppler no significant stenosis. Echo looks okay. MRI pending. -Continue aspirin daily. Seen by neurology.  *Acute on chronic headache Noted history of migraines-this is different than her usual headache ?Secondary to acute TIA versus CVA -as needed pain meds. Patient recommended to follow up headache clinic if gets frequent episodes  *Chronic GERD without esophagitis PPI daily  *Chronic hyperlipidemia, unspecified Statin therapy check lipids in the morning  *due to blood pressure with her diagnosis of hypertension  -suspect may be related to headache/pain  -will allow for permissive hypertension, IV hydralazine as needed systolic blood pressure greater than  180, vitals per routine, make changes as per necessary -overall blood pressure stable. Will not start her on any chronic blood pressure medication. Patient advised to keep log of blood pressure at home and discussed with primary care physician.      CONSULTS OBTAINED:  Treatment Team:  Leotis Pain, MD  DRUG ALLERGIES:  No Known Allergies  DISCHARGE MEDICATIONS:   Allergies as of 10/16/2017   No Known Allergies     Medication List    TAKE these medications   acetaminophen 500 MG tablet Commonly known as:  TYLENOL Take 1,000 mg by mouth every 6 (six) hours as needed for moderate pain or headache.   aspirin EC 81 MG tablet Take 81 mg by mouth daily.   cetirizine 10 MG tablet Commonly known as:  ZYRTEC Take 1 tablet by mouth daily as needed for allergies.   ibuprofen 200 MG tablet Commonly known as:  ADVIL,MOTRIN Take 400 mg by mouth every 6 (six) hours as needed for headache or moderate pain.   letrozole 2.5 MG tablet Commonly known as:  FEMARA TAKE ONE TABLET BY MOUTH EVERY DAY   ROLAIDS PO Take 1 tablet by mouth as needed ((stomach acid)).   rosuvastatin 20 MG tablet Commonly known as:  CRESTOR Take 20 mg by mouth daily.       If you experience worsening of your admission symptoms, develop shortness of breath, life threatening emergency, suicidal or homicidal thoughts you must seek medical attention immediately by calling 911 or calling your MD immediately  if symptoms less severe.  You Must read complete instructions/literature along with all the possible adverse reactions/side effects for all the Medicines you take and that have been prescribed  to you. Take any new Medicines after you have completely understood and accept all the possible adverse reactions/side effects.   Please note  You were cared for by a hospitalist during your hospital stay. If you have any questions about your discharge medications or the care you received while you were in the  hospital after you are discharged, you can call the unit and asked to speak with the hospitalist on call if the hospitalist that took care of you is not available. Once you are discharged, your primary care physician will handle any further medical issues. Please note that NO REFILLS for any discharge medications will be authorized once you are discharged, as it is imperative that you return to your primary care physician (or establish a relationship with a primary care physician if you do not have one) for your aftercare needs so that they can reassess your need for medications and monitor your lab values. T  DATA REVIEW:   CBC  No results for input(s): WBC, HGB, HCT, PLT in the last 168 hours.  Chemistries  No results for input(s): NA, K, CL, CO2, GLUCOSE, BUN, CREATININE, CALCIUM, MG, AST, ALT, ALKPHOS, BILITOT in the last 168 hours.  Invalid input(s): [place]  Microbiology Results   No results found for this or any previous visit (from the past 240 hour(s)).  RADIOLOGY:  No results found.   Management plans discussed with the patient, family and they are in agreement.  CODE STATUS:  Code Status History    Date Active Date Inactive Code Status Order ID Comments User Context   10/16/2017 1340 10/16/2017 2123 Full Code [id number]  Fritzi Mandes, MD Inpatient   10/16/2017 1340 10/16/2017 1340 DNR [id number]  Fritzi Mandes, MD Inpatient   10/15/2017 1925 10/16/2017 1340 Full Code [id number]  Salary, Avel Peace, MD Inpatient      TOTAL TIME TAKING CARE OF THIS PATIENT: 40 minutes.    Fritzi Mandes M.D on 10/30/2017 at 11:22 AM  Between 7am to 6pm - Pager - [phone] After 6pm go to [email or web address] - password [email or web address] [facility]  Office  [phone]  CC: Primary care physician; System, Provider Not In

## 2017-11-13 NOTE — Progress Notes (Signed)
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 Low risk Mammoprint.  INTERVAL HISTORY: Patient returns to clinic today for routine six-month evaluation.  She continues to tolerate letrozole without significant side effects.  She currently feels well and is asymptomatic. She has no neurologic complaints. She has a good appetite and denies any weight loss. She has no recent fevers or illnesses. She denies any chest pain or shortness of breath. She denies any nausea, vomiting, constipation, or diarrhea. She has no urinary complaints.  Patient feels at her baseline offers no specific complaints today.  REVIEW OF SYSTEMS:   Review of Systems  Constitutional: Negative.  Negative for fever, malaise/fatigue and weight loss.  Respiratory: Negative.  Negative for cough and shortness of breath.   Cardiovascular: Negative.  Negative for chest pain and leg swelling.  Gastrointestinal: Negative.  Negative for abdominal pain.  Genitourinary: Negative.  Negative for dysuria.  Musculoskeletal: Negative.  Negative for back pain and myalgias.  Skin: Negative.  Negative for rash.  Neurological: Negative.  Negative for sensory change, focal weakness and weakness.  Psychiatric/Behavioral: Negative.  The patient is not nervous/anxious.     As per HPI. Otherwise, a complete review of systems is negative.  PAST MEDICAL HISTORY: Past Medical History:  Diagnosis Date  . Anxiety   . Arthritis    FINGERS  . Cancer ([facility]) 03/2016   left breast  . GERD (gastroesophageal reflux disease)   . Headache    H/O MIGRAINES  . Personal history of radiation therapy 2017/2018   left breast ca    PAST  SURGICAL HISTORY: Past Surgical History:  Procedure Laterality Date  . BILATERAL CARPAL TUNNEL RELEASE    . BREAST BIOPSY Left 03/31/2016   invasive mammary  . BREAST LUMPECTOMY Left 04/29/2016   invasive mammary carcinoma. Clear margins  . CESAREAN SECTION    . CHOLECYSTECTOMY    . PARTIAL MASTECTOMY WITH NEEDLE LOCALIZATION Left 04/29/2016   Procedure: PARTIAL MASTECTOMY WITH NEEDLE LOCALIZATION;  Surgeon: Jarvis Wilton Smith, MD;  Location: [facility] ORS;  Service: General;  Laterality: Left;  . SENTINEL NODE BIOPSY Left 04/29/2016   Procedure: SENTINEL NODE BIOPSY;  Surgeon: Jarvis Wilton Smith, MD;  Location: [facility] ORS;  Service: General;  Laterality: Left;    FAMILY HISTORY: Family History  Problem Relation Age of Onset  . Breast cancer Neg Hx     ADVANCED DIRECTIVES (Y/N):  N  HEALTH MAINTENANCE: Social History   Tobacco Use  . Smoking status: Never Smoker  . Smokeless tobacco: Never Used  Substance Use Topics  . Alcohol use: Yes    Comment: occasional  . Drug use: No     Colonoscopy:  PAP:  Bone density:  Lipid panel:  No Known Allergies  Current Outpatient Medications  Medication Sig Dispense Refill  . acetaminophen (TYLENOL) 500 MG tablet Take 1,000 mg by mouth every 6 (six) hours as needed for moderate pain or headache.    . aspirin EC 81 MG tablet Take 81 mg by mouth daily.    . Ca Carbonate-Mag Hydroxide (ROLAIDS PO) Take 1 tablet by mouth as needed ((stomach acid)).     . cetirizine (ZYRTEC) 10 MG tablet Take 1 tablet by mouth daily as needed for   allergies.     Marland Kitchen ibuprofen (ADVIL,MOTRIN) 200 MG tablet Take 400 mg by mouth every 6 (six) hours as needed for headache or moderate pain.    Marland Kitchen letrozole (FEMARA) 2.5 MG tablet TAKE ONE TABLET BY MOUTH EVERY DAY 90 tablet 3  . rosuvastatin (CRESTOR) 20 MG tablet Take 20 mg by mouth daily.     No current facility-administered medications for this visit.     OBJECTIVE: Vitals:   11/16/17 1424  BP: (!) 138/99    Pulse: 72  Resp: 12  Temp: 98 F (36.7 C)     Body mass index is 32.51 kg/m.    ECOG FS:0 - Asymptomatic  General: Well-developed, well-nourished, no acute distress. Eyes: Pink conjunctiva, anicteric sclera. Breast: Patient declined breast exam today. Lungs: Clear to auscultation bilaterally. Heart: Regular rate and rhythm. No rubs, murmurs, or gallops. Abdomen: Soft, nontender, nondistended. No organomegaly noted, normoactive bowel sounds. Musculoskeletal: No edema, cyanosis, or clubbing. Neuro: Alert, answering all questions appropriately. Cranial nerves grossly intact. Skin: No rashes or petechiae noted. Psych: Normal affect.  LAB RESULTS:  Lab Results  Component Value Date   NA 138 10/15/2017   K 4.1 10/15/2017   CL 103 10/15/2017   CO2 25 10/15/2017   GLUCOSE 101 (H) 10/15/2017   BUN 20 10/15/2017   CREATININE 0.73 10/15/2017   CALCIUM 10.2 10/15/2017   PROT 7.8 10/15/2017   ALBUMIN 4.7 10/15/2017   AST 31 10/15/2017   ALT 32 10/15/2017   ALKPHOS 67 10/15/2017   BILITOT 0.5 10/15/2017   GFRNONAA >60 10/15/2017   GFRAA >60 10/15/2017    Lab Results  Component Value Date   WBC 8.3 10/15/2017   HGB 14.7 10/15/2017   HCT 41.5 10/15/2017   MCV 89.7 10/15/2017   PLT 260 10/15/2017     STUDIES: No results found.  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 Low risk Mammoprint.  PLAN:    1. 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 Low risk Mammoprint: Patient completed adjuvant XRT in April 2018.  She did not require chemotherapy given her low risk Mammoprint.  Continue letrozole for a total of 5 years completing in April 2023.  Patient's most recent mammogram on March 09, 2017 was reported as BI-RADS 2, repeat in October 2019.  Return to clinic in 6 months for routine evaluation.  2.  Osteopenia:  Patient had a bone marrow density on August 30, 2016 was reported T score of -1.2.  This does not need to be repeated until April 2020.  Continue calcium and vitamin D supplementation as directed.  I spent a total of 15 minutes face-to-face with the patient of which greater than 50% of the visit was spent in counseling and coordination of care as summarized above.  Patient expressed understanding and was in agreement with this plan. She also understands that She can call clinic at any time with any questions, concerns, or complaints.   Cancer Staging Primary cancer of upper outer quadrant of left female breast [facility]) Staging form: Breast, AJCC 7th Edition - Pathologic stage from 05/09/2016: Stage IA (T1c, N0, cM0) - Signed by Lloyd Huger, MD on 05/09/2016    Lloyd Huger, MD 11/19/17 11:07 AM

## 2017-11-16 ENCOUNTER — Inpatient Hospital Stay: Payer: BLUE CROSS/BLUE SHIELD | Attending: Oncology | Admitting: Oncology

## 2017-11-16 ENCOUNTER — Encounter: Payer: Self-pay | Admitting: Oncology

## 2017-11-16 ENCOUNTER — Other Ambulatory Visit: Payer: Self-pay

## 2017-11-16 VITALS — BP 138/99 | HR 72 | Temp 98.0°F | Resp 12 | Ht 63.0 in | Wt 183.5 lb

## 2017-11-16 DIAGNOSIS — C50412 Malignant neoplasm of upper-outer quadrant of left female breast: Secondary | ICD-10-CM | POA: Insufficient documentation

## 2017-11-16 DIAGNOSIS — Z170 Estrogen receptor positive status [ER+]: Secondary | ICD-10-CM | POA: Diagnosis not present

## 2017-11-16 DIAGNOSIS — Z79811 Long term (current) use of aromatase inhibitors: Secondary | ICD-10-CM | POA: Insufficient documentation

## 2017-11-16 DIAGNOSIS — M8580 Other specified disorders of bone density and structure, unspecified site: Secondary | ICD-10-CM | POA: Insufficient documentation

## 2017-11-16 DIAGNOSIS — Z923 Personal history of irradiation: Secondary | ICD-10-CM | POA: Insufficient documentation

## 2017-11-16 DIAGNOSIS — K219 Gastro-esophageal reflux disease without esophagitis: Secondary | ICD-10-CM | POA: Insufficient documentation

## 2017-11-16 DIAGNOSIS — F419 Anxiety disorder, unspecified: Secondary | ICD-10-CM | POA: Diagnosis not present

## 2017-11-16 DIAGNOSIS — Z79899 Other long term (current) drug therapy: Secondary | ICD-10-CM | POA: Diagnosis not present

## 2017-11-16 DIAGNOSIS — Z7982 Long term (current) use of aspirin: Secondary | ICD-10-CM | POA: Insufficient documentation

## 2017-11-16 NOTE — Progress Notes (Signed)
Patient here for follow no changes since last visit.

## 2018-03-12 ENCOUNTER — Ambulatory Visit
Admission: RE | Admit: 2018-03-12 | Discharge: 2018-03-12 | Disposition: A | Discharge status: Home / Self Care | Payer: BLUE CROSS/BLUE SHIELD | Source: Ambulatory Visit | Attending: Oncology | Admitting: Oncology

## 2018-03-12 DIAGNOSIS — C50412 Malignant neoplasm of upper-outer quadrant of left female breast: Secondary | ICD-10-CM

## 2018-05-06 NOTE — Progress Notes (Deleted)
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 Low risk Mammoprint.  INTERVAL HISTORY: Patient returns to clinic today for routine six-month evaluation.  She continues to tolerate letrozole without significant side effects.  She currently feels well and is asymptomatic. She has no neurologic complaints. She has a good appetite and denies any weight loss. She has no recent fevers or illnesses. She denies any chest pain or shortness of breath. She denies any nausea, vomiting, constipation, or diarrhea. She has no urinary complaints.  Patient feels at her baseline offers no specific complaints today.  REVIEW OF SYSTEMS:   Review of Systems  Constitutional: Negative.  Negative for fever, malaise/fatigue and weight loss.  Respiratory: Negative.  Negative for cough and shortness of breath.   Cardiovascular: Negative.  Negative for chest pain and leg swelling.  Gastrointestinal: Negative.  Negative for abdominal pain.  Genitourinary: Negative.  Negative for dysuria.  Musculoskeletal: Negative.  Negative for back pain and myalgias.  Skin: Negative.  Negative for rash.  Neurological: Negative.  Negative for sensory change, focal weakness and weakness.  Psychiatric/Behavioral: Negative.  The patient is not nervous/anxious.     As per HPI. Otherwise, a complete review of systems is negative.  PAST MEDICAL HISTORY: Past Medical History:  Diagnosis Date  . Anxiety   . Arthritis    FINGERS  . Cancer ([place]) 03/2016   left breast  . GERD (gastroesophageal reflux disease)   . Headache    H/O MIGRAINES  . Personal history of radiation therapy 2017/2018   left breast ca    PAST  SURGICAL HISTORY: Past Surgical History:  Procedure Laterality Date  . BILATERAL CARPAL TUNNEL RELEASE    . BREAST BIOPSY Left 03/31/2016   invasive mammary  . BREAST LUMPECTOMY Left 04/29/2016   invasive mammary carcinoma. Clear margins  . CESAREAN SECTION    . CHOLECYSTECTOMY    . PARTIAL MASTECTOMY WITH NEEDLE LOCALIZATION Left 04/29/2016   Procedure: PARTIAL MASTECTOMY WITH NEEDLE LOCALIZATION;  Surgeon: Leonie Green, MD;  Location: [facility] ORS;  Service: General;  Laterality: Left;  . SENTINEL NODE BIOPSY Left 04/29/2016   Procedure: SENTINEL NODE BIOPSY;  Surgeon: Leonie Green, MD;  Location: [facility] ORS;  Service: General;  Laterality: Left;    FAMILY HISTORY: Family History  Problem Relation Age of Onset  . Breast cancer Neg Hx     ADVANCED DIRECTIVES (Y/N):  N  HEALTH MAINTENANCE: Social History   Tobacco Use  . Smoking status: Never Smoker  . Smokeless tobacco: Never Used  Substance Use Topics  . Alcohol use: Yes    Comment: occasional  . Drug use: No     Colonoscopy:  PAP:  Bone density:  Lipid panel:  No Known Allergies  Current Outpatient Medications  Medication Sig Dispense Refill  . acetaminophen (TYLENOL) 500 MG tablet Take 1,000 mg by mouth every 6 (six) hours as needed for moderate pain or headache.    Marland Kitchen aspirin EC 81 MG tablet Take 81 mg by mouth daily.    . Ca Carbonate-Mag Hydroxide (ROLAIDS PO) Take 1 tablet by mouth as needed ((stomach acid)).     . cetirizine (ZYRTEC) 10 MG tablet Take 1 tablet by mouth daily as needed for  allergies.     Marland Kitchen ibuprofen (ADVIL,MOTRIN) 200 MG tablet Take 400 mg by mouth every 6 (six) hours as needed for headache or moderate pain.    Marland Kitchen letrozole (FEMARA) 2.5 MG tablet TAKE ONE TABLET BY MOUTH EVERY DAY 90 tablet 3  . rosuvastatin (CRESTOR) 20 MG tablet Take 20 mg by mouth daily.     No current facility-administered medications for this visit.     OBJECTIVE: There were no vitals filed for this visit.    There is no height or weight on file to calculate BMI.    ECOG FS:0 - Asymptomatic  General: Well-developed, well-nourished, no acute distress. Eyes: Pink conjunctiva, anicteric sclera. Breast: Patient declined breast exam today. Lungs: Clear to auscultation bilaterally. Heart: Regular rate and rhythm. No rubs, murmurs, or gallops. Abdomen: Soft, nontender, nondistended. No organomegaly noted, normoactive bowel sounds. Musculoskeletal: No edema, cyanosis, or clubbing. Neuro: Alert, answering all questions appropriately. Cranial nerves grossly intact. Skin: No rashes or petechiae noted. Psych: Normal affect.  LAB RESULTS:  Lab Results  Component Value Date   NA 138 10/15/2017   K 4.1 10/15/2017   CL 103 10/15/2017   CO2 25 10/15/2017   GLUCOSE 101 (H) 10/15/2017   BUN 20 10/15/2017   CREATININE 0.73 10/15/2017   CALCIUM 10.2 10/15/2017   PROT 7.8 10/15/2017   ALBUMIN 4.7 10/15/2017   AST 31 10/15/2017   ALT 32 10/15/2017   ALKPHOS 67 10/15/2017   BILITOT 0.5 10/15/2017   GFRNONAA >60 10/15/2017   GFRAA >60 10/15/2017    Lab Results  Component Value Date   WBC 8.3 10/15/2017   HGB 14.7 10/15/2017   HCT 41.5 10/15/2017   MCV 89.7 10/15/2017   PLT 260 10/15/2017     STUDIES: No results found.  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 Low risk Mammoprint.  PLAN:    1. 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 Low risk Mammoprint: Patient completed adjuvant XRT in April 2018.  She did not require chemotherapy given her low risk Mammoprint.  Continue letrozole for a total of 5 years completing in April 2023.  Patient's most recent mammogram on March 09, 2017 was reported as BI-RADS 2, repeat in October 2019.  Return to clinic in 6 months for routine evaluation.  2.  Osteopenia: Patient had a bone marrow  density on August 30, 2016 was reported T score of -1.2.  This does not need to be repeated until April 2020.  Continue calcium and vitamin D supplementation as directed.  I spent a total of 15 minutes face-to-face with the patient of which greater than 50% of the visit was spent in counseling and coordination of care as summarized above.  Patient expressed understanding and was in agreement with this plan. She also understands that She can call clinic at any time with any questions, concerns, or complaints.   Cancer Staging Primary cancer of upper outer quadrant of left female breast [facility]) Staging form: Breast, AJCC 7th Edition - Pathologic stage from 05/09/2016: Stage IA (T1c, N0, cM0) - Signed by Lloyd Huger, MD on 05/09/2016    Lloyd Huger, MD 05/06/18 8:01 AM

## 2018-05-10 ENCOUNTER — Inpatient Hospital Stay: Payer: BLUE CROSS/BLUE SHIELD | Admitting: Oncology

## 2018-08-10 ENCOUNTER — Encounter: Payer: Self-pay | Admitting: *Deleted

## 2018-08-23 ENCOUNTER — Ambulatory Visit: Payer: BLUE CROSS/BLUE SHIELD | Admitting: Radiation Oncology

## 2018-09-06 ENCOUNTER — Other Ambulatory Visit: Payer: Self-pay | Admitting: Oncology

## 2018-10-24 ENCOUNTER — Other Ambulatory Visit: Payer: Self-pay

## 2018-10-25 ENCOUNTER — Ambulatory Visit: Payer: BLUE CROSS/BLUE SHIELD | Admitting: Radiation Oncology

## 2018-11-02 ENCOUNTER — Other Ambulatory Visit: Payer: Self-pay

## 2018-11-05 ENCOUNTER — Ambulatory Visit
Admission: RE | Admit: 2018-11-05 | Discharge: 2018-11-05 | Disposition: A | Discharge status: Home / Self Care | Payer: BLUE CROSS/BLUE SHIELD | Source: Ambulatory Visit | Attending: Radiation Oncology | Admitting: Radiation Oncology

## 2018-11-05 ENCOUNTER — Encounter: Payer: Self-pay | Admitting: Radiation Oncology

## 2018-11-05 ENCOUNTER — Other Ambulatory Visit: Payer: Self-pay

## 2018-11-05 VITALS — BP 168/83 | HR 68 | Temp 97.2°F | Resp 18 | Wt 195.7 lb

## 2018-11-05 DIAGNOSIS — Z170 Estrogen receptor positive status [ER+]: Secondary | ICD-10-CM | POA: Diagnosis not present

## 2018-11-05 DIAGNOSIS — Z79811 Long term (current) use of aromatase inhibitors: Secondary | ICD-10-CM | POA: Insufficient documentation

## 2018-11-05 DIAGNOSIS — C50412 Malignant neoplasm of upper-outer quadrant of left female breast: Secondary | ICD-10-CM | POA: Insufficient documentation

## 2018-11-05 DIAGNOSIS — Z923 Personal history of irradiation: Secondary | ICD-10-CM | POA: Insufficient documentation

## 2018-11-05 NOTE — Progress Notes (Signed)
Radiation Oncology Follow up Note  Name: Jill Reilly   Date:   11/05/2018 MRN:  [id number] DOB: 04-Apr-1956    This 63 y.o. female presents to the clinic today for 66-month follow-up status post whole breast radiation to her left breast for stage I ER PR positive invasive mammary carcinoma.  REFERRING PROVIDER: Kirk Ruths, MD  HPI: Patient is a 63 year old female now about 18 months having completed whole breast radiation to her left breast for stage I ER PR positive invasive mammary carcinoma.  She is seen today in routine follow-up and is doing well.  She specifically denies breast tenderness cough or bone pain..  She had mammograms back in October which I have reviewed were BI-RADS 2 benign.  She is current on letrozole tolerating that well without side effect.  COMPLICATIONS OF TREATMENT: none  FOLLOW UP COMPLIANCE: keeps appointments   PHYSICAL EXAM:  BP (!) 168/83 (BP Location: Left Arm, Patient Position: Sitting)   Pulse 68   Temp (!) 97.2 F (36.2 C) (Tympanic)   Resp 18   Wt 195 lb 10.5 oz (88.7 kg)   BMI 34.66 kg/m  Lungs are clear to A&P cardiac examination essentially unremarkable with regular rate and rhythm. No dominant mass or nodularity is noted in either breast in 2 positions examined. Incision is well-healed. No axillary or supraclavicular adenopathy is appreciated. Cosmetic result is excellent.  Well-developed well-nourished patient in NAD. HEENT reveals PERLA, EOMI, discs not visualized.  Oral cavity is clear. No oral mucosal lesions are identified. Neck is clear without evidence of cervical or supraclavicular adenopathy. Lungs are clear to A&P. Cardiac examination is essentially unremarkable with regular rate and rhythm without murmur rub or thrill. Abdomen is benign with no organomegaly or masses noted. Motor sensory and DTR levels are equal and symmetric in the upper and lower extremities. Cranial nerves II through XII are grossly intact. Proprioception is  intact. No peripheral adenopathy or edema is identified. No motor or sensory levels are noted. Crude visual fields are within normal range.  RADIOLOGY RESULTS: Mammograms reviewed and compatible with above-stated findings  PLAN: Present time she continues to do well with no evidence of disease.  I am pleased with her overall progress.  She continues on letrozole without side effect.  I have asked to see her back in 1 year for follow-up.  She is already scheduled for follow-up mammograms.  Patient knows to call with any concerns.  I would like to take this opportunity to thank you for allowing me to participate in the care of your patient.Noreene Filbert, MD

## 2018-11-29 ENCOUNTER — Other Ambulatory Visit: Payer: Self-pay | Admitting: Oncology

## 2018-12-07 ENCOUNTER — Other Ambulatory Visit: Payer: Self-pay | Admitting: Oncology

## 2019-03-14 ENCOUNTER — Other Ambulatory Visit: Payer: Self-pay | Admitting: Internal Medicine

## 2019-03-14 DIAGNOSIS — Z1231 Encounter for screening mammogram for malignant neoplasm of breast: Secondary | ICD-10-CM

## 2019-05-16 ENCOUNTER — Ambulatory Visit: Payer: Medicare Other | Attending: Internal Medicine

## 2019-05-16 DIAGNOSIS — Z20822 Contact with and (suspected) exposure to covid-19: Secondary | ICD-10-CM

## 2019-05-17 LAB — NOVEL CORONAVIRUS, NAA: SARS-CoV-2, NAA: NOT DETECTED

## 2019-11-11 ENCOUNTER — Ambulatory Visit: Payer: Medicare Other | Admitting: Radiation Oncology

## 2020-03-03 ENCOUNTER — Other Ambulatory Visit: Payer: Self-pay | Admitting: Internal Medicine

## 2020-03-03 DIAGNOSIS — Z853 Personal history of malignant neoplasm of breast: Secondary | ICD-10-CM

## 2020-03-27 ENCOUNTER — Other Ambulatory Visit: Payer: Self-pay

## 2020-03-27 ENCOUNTER — Ambulatory Visit
Admission: RE | Admit: 2020-03-27 | Discharge: 2020-03-27 | Disposition: A | Discharge status: Home / Self Care | Payer: Medicare Other | Source: Ambulatory Visit | Attending: Internal Medicine | Admitting: Internal Medicine

## 2020-03-27 DIAGNOSIS — Z853 Personal history of malignant neoplasm of breast: Secondary | ICD-10-CM | POA: Insufficient documentation

## 2020-12-09 ENCOUNTER — Other Ambulatory Visit: Payer: Self-pay | Admitting: Internal Medicine

## 2020-12-09 DIAGNOSIS — Z1231 Encounter for screening mammogram for malignant neoplasm of breast: Secondary | ICD-10-CM

## 2021-03-29 ENCOUNTER — Ambulatory Visit
Admission: RE | Admit: 2021-03-29 | Discharge: 2021-03-29 | Disposition: A | Discharge status: Home / Self Care | Payer: Medicare Other | Source: Ambulatory Visit | Attending: Internal Medicine | Admitting: Internal Medicine

## 2021-03-29 ENCOUNTER — Other Ambulatory Visit: Payer: Self-pay

## 2021-03-29 DIAGNOSIS — Z1231 Encounter for screening mammogram for malignant neoplasm of breast: Secondary | ICD-10-CM | POA: Diagnosis not present

## 2022-08-01 ENCOUNTER — Encounter: Payer: Self-pay | Admitting: Ophthalmology

## 2022-08-08 NOTE — Discharge Instructions (Signed)

## 2022-08-10 ENCOUNTER — Ambulatory Visit: Payer: Medicare HMO | Admitting: Anesthesiology

## 2022-08-10 ENCOUNTER — Ambulatory Visit
Admission: RE | Admit: 2022-08-10 | Discharge: 2022-08-10 | Disposition: A | Discharge status: Home / Self Care | Payer: Medicare HMO | Attending: Ophthalmology | Admitting: Ophthalmology

## 2022-08-10 ENCOUNTER — Encounter
Admission: RE | Disposition: A | Discharge status: Home / Self Care | Payer: Self-pay | Source: Home / Self Care | Attending: Ophthalmology

## 2022-08-10 ENCOUNTER — Other Ambulatory Visit: Payer: Self-pay

## 2022-08-10 ENCOUNTER — Encounter: Payer: Self-pay | Admitting: Ophthalmology

## 2022-08-10 DIAGNOSIS — K219 Gastro-esophageal reflux disease without esophagitis: Secondary | ICD-10-CM | POA: Diagnosis not present

## 2022-08-10 DIAGNOSIS — F419 Anxiety disorder, unspecified: Secondary | ICD-10-CM | POA: Insufficient documentation

## 2022-08-10 DIAGNOSIS — H2512 Age-related nuclear cataract, left eye: Secondary | ICD-10-CM | POA: Diagnosis present

## 2022-08-10 HISTORY — PX: CATARACT EXTRACTION W/PHACO: SHX586

## 2022-08-10 SURGERY — PHACOEMULSIFICATION, CATARACT, WITH IOL INSERTION
Anesthesia: Monitor Anesthesia Care | Site: Eye | Laterality: Left

## 2022-08-10 MED ORDER — CEFUROXIME OPHTHALMIC INJECTION 1 MG/0.1 ML
INJECTION | OPHTHALMIC | Status: DC | PRN
  Administered 2022-08-10: .1 mL via INTRACAMERAL

## 2022-08-10 MED ORDER — TETRACAINE HCL 0.5 % OP SOLN
1.0000 [drp] | OPHTHALMIC | Status: DC | PRN
  Administered 2022-08-10 (×3): 1 [drp] via OPHTHALMIC

## 2022-08-10 MED ORDER — BRIMONIDINE TARTRATE-TIMOLOL 0.2-0.5 % OP SOLN
OPHTHALMIC | Status: DC | PRN
  Administered 2022-08-10: 1 [drp] via OPHTHALMIC

## 2022-08-10 MED ORDER — ARMC OPHTHALMIC DILATING DROPS
1.0000 | OPHTHALMIC | Status: DC | PRN
  Administered 2022-08-10 (×3): 1 via OPHTHALMIC

## 2022-08-10 MED ORDER — SIGHTPATH DOSE#1 NA HYALUR & NA CHOND-NA HYALUR IO KIT
PACK | INTRAOCULAR | Status: DC | PRN
  Administered 2022-08-10: 1 via OPHTHALMIC

## 2022-08-10 MED ORDER — SIGHTPATH DOSE#1 BSS IO SOLN
INTRAOCULAR | Status: DC | PRN
  Administered 2022-08-10: 1 mL via INTRAMUSCULAR

## 2022-08-10 MED ORDER — SIGHTPATH DOSE#1 BSS IO SOLN
INTRAOCULAR | Status: DC | PRN
  Administered 2022-08-10: 15 mL

## 2022-08-10 MED ORDER — FENTANYL CITRATE (PF) 100 MCG/2ML IJ SOLN
INTRAMUSCULAR | Status: DC | PRN
  Administered 2022-08-10 (×2): 50 ug via INTRAVENOUS

## 2022-08-10 MED ORDER — SIGHTPATH DOSE#1 BSS IO SOLN
INTRAOCULAR | Status: DC | PRN
  Administered 2022-08-10: 66 mL via OPHTHALMIC

## 2022-08-10 MED ORDER — MIDAZOLAM HCL 2 MG/2ML IJ SOLN
INTRAMUSCULAR | Status: DC | PRN
  Administered 2022-08-10: 2 mg via INTRAVENOUS

## 2022-08-10 MED ORDER — LACTATED RINGERS IV SOLN: INTRAVENOUS | Status: DC

## 2022-08-10 SURGICAL SUPPLY — 11 items
CATARACT SUITE SIGHTPATH (MISCELLANEOUS) ×1 IMPLANT
FEE CATARACT SUITE SIGHTPATH (MISCELLANEOUS) ×1 IMPLANT
GLOVE SRG 8 PF TXTR STRL LF DI (GLOVE) ×1 IMPLANT
GLOVE SURG ENC TEXT LTX SZ7.5 (GLOVE) ×1 IMPLANT
GLOVE SURG UNDER POLY LF SZ8 (GLOVE) ×1
LENS CLAREON TORIC CNW0T4 15.0 ×1 IMPLANT
LENS IOL CLRN TRC 4 15.0 IMPLANT
NDL FILTER BLUNT 18X1 1/2 (NEEDLE) ×1 IMPLANT
NEEDLE FILTER BLUNT 18X1 1/2 (NEEDLE) ×1 IMPLANT
SYR 3ML LL SCALE MARK (SYRINGE) ×1 IMPLANT
WATER STERILE IRR 250ML POUR (IV SOLUTION) ×1 IMPLANT

## 2022-08-10 NOTE — Addendum Note (Signed)
Addendum  created 08/10/22 1308 by Liyana Suniga, Precious Haws, MD   Attestation recorded in [place], [place] filed

## 2022-08-10 NOTE — Anesthesia Preprocedure Evaluation (Signed)
Anesthesia Evaluation  Patient identified by MRN, date of birth, ID band Patient awake    Reviewed: Allergy & Precautions, NPO status , Patient's Chart, lab work & pertinent test results  History of Anesthesia Complications Negative for: history of anesthetic complications  Airway Mallampati: III  TM Distance: <3 FB Neck ROM: full    Dental  (+) Chipped   Pulmonary neg pulmonary ROS, neg shortness of breath   Pulmonary exam normal        Cardiovascular Exercise Tolerance: Good (-) angina (-) Past MI negative cardio ROS Normal cardiovascular exam     Neuro/Psych  Headaches  Anxiety        GI/Hepatic Neg liver ROS,GERD  Controlled,,  Endo/Other  negative endocrine ROS    Renal/GU      Musculoskeletal   Abdominal   Peds  Hematology negative hematology ROS (+)   Anesthesia Other Findings Past Medical History: No date: Anxiety No date: Arthritis     Comment:  FINGERS 03/2016: Cancer ([place])     Comment:  left breast No date: GERD (gastroesophageal reflux disease) No date: Headache     Comment:  H/O MIGRAINES 2017/2018: Personal history of radiation therapy     Comment:  left breast ca  Past Surgical History: No date: BILATERAL CARPAL TUNNEL RELEASE 03/31/2016: BREAST BIOPSY; Left     Comment:  invasive mammary 04/29/2016: BREAST LUMPECTOMY; Left     Comment:  invasive mammary carcinoma. Clear margins No date: CESAREAN SECTION No date: CHOLECYSTECTOMY 04/29/2016: PARTIAL MASTECTOMY WITH NEEDLE LOCALIZATION; Left     Comment:  Procedure: PARTIAL MASTECTOMY WITH NEEDLE LOCALIZATION;               Surgeon: Leonie Green, MD;  Location: [facility] ORS;                Service: General;  Laterality: Left; 04/29/2016: SENTINEL NODE BIOPSY; Left     Comment:  Procedure: SENTINEL NODE BIOPSY;  Surgeon: Leonie Green, MD;  Location: [facility] OR[facility];  Service: General;                Laterality:  Left;  BMI    Body Mass Index: 35.39 kg/m      Reproductive/Obstetrics negative OB ROS                             Anesthesia Physical Anesthesia Plan  ASA: 3  Anesthesia Plan: MAC   Post-op Pain Management:    Induction: Intravenous  PONV Risk Score and Plan:   Airway Management Planned: Natural Airway and Nasal Cannula  Additional Equipment:   Intra-op Plan:   Post-operative Plan:   Informed Consent: I have reviewed the patients History and Physical, chart, labs and discussed the procedure including the risks, benefits and alternatives for the proposed anesthesia with the patient or authorized representative who has indicated his/her understanding and acceptance.     Dental Advisory Given  Plan Discussed with: Anesthesiologist, CRNA and Surgeon  Anesthesia Plan Comments: (Patient consented for risks of anesthesia including but not limited to:  - adverse reactions to medications - damage to eyes, teeth, lips or other oral mucosa - nerve damage due to positioning  - sore throat or hoarseness - Damage to heart, brain, nerves, lungs, other parts of body or loss of life  Patient voiced understanding.)       Anesthesia Quick Evaluation

## 2022-08-10 NOTE — Transfer of Care (Signed)
Immediate Anesthesia Transfer of Care Note  Patient: Jill Reilly  Procedure(s) Performed: CATARACT EXTRACTION PHACO AND INTRAOCULAR LENS PLACEMENT (IOC) LEFT  CLAREON TORIC LENS  6.33  00:54.6 (Left: Eye)  Patient Location: PACU  Anesthesia Type: MAC  Level of Consciousness: awake, alert  and patient cooperative  Airway and Oxygen Therapy: Patient Spontanous Breathing and Patient connected to supplemental oxygen  Post-op Assessment: Post-op Vital signs reviewed, Patient's Cardiovascular Status Stable, Respiratory Function Stable, Patent Airway and No signs of Nausea or vomiting  Post-op Vital Signs: Reviewed and stable  Complications: No notable events documented.

## 2022-08-10 NOTE — H&P (Signed)
[facility]   Primary Care Physician:  Kirk Ruths, MD Ophthalmologist: Dr. Leandrew Koyanagi  Pre-Procedure History & Physical: HPI:  Jill Reilly is a 67 y.o. female here for ophthalmic surgery.   Past Medical History:  Diagnosis Date   Anxiety    Arthritis    FINGERS   Cancer ([place]) 03/2016   left breast   GERD (gastroesophageal reflux disease)    Headache    H/O MIGRAINES   Personal history of radiation therapy 2017/2018   left breast ca    Past Surgical History:  Procedure Laterality Date   BILATERAL CARPAL TUNNEL RELEASE     BREAST BIOPSY Left 03/31/2016   invasive mammary   BREAST LUMPECTOMY Left 04/29/2016   invasive mammary carcinoma. Clear margins   CESAREAN SECTION     CHOLECYSTECTOMY     PARTIAL MASTECTOMY WITH NEEDLE LOCALIZATION Left 04/29/2016   Procedure: PARTIAL MASTECTOMY WITH NEEDLE LOCALIZATION;  Surgeon: Leonie Green, MD;  Location: [facility] OR[facility];  Service: General;  Laterality: Left;   SENTINEL NODE BIOPSY Left 04/29/2016   Procedure: SENTINEL NODE BIOPSY;  Surgeon: Leonie Green, MD;  Location: [facility] OR[facility];  Service: General;  Laterality: Left;    Prior to Admission medications   Medication Sig Start Date End Date Taking? Authorizing Provider  acetaminophen (TYLENOL) 500 MG tablet Take 1,000 mg by mouth every 6 (six) hours as needed for moderate pain or headache.   Yes [provider]  acidophilus (RISAQUAD) CAPS capsule Take by mouth daily.   Yes [provider]  Ca Carbonate-Mag Hydroxide (ROLAIDS PO) Take 1 tablet by mouth as needed ((stomach acid)).    Yes [provider]  ibuprofen (ADVIL,MOTRIN) 200 MG tablet Take 400 mg by mouth every 6 (six) hours as needed for headache or moderate pain.   Yes [provider]  propranolol (INDERAL) 40 MG tablet Take 40 mg by mouth 1 day or 1 dose.   Yes [provider]  aspirin EC 81 MG tablet Take 81 mg by mouth daily. Patient not taking:  Reported on 08/01/2022    [provider]  azelastine (ASTELIN) 0.1 % nasal spray Place into the nose. 06/26/18 06/26/19  [provider]  cetirizine (ZYRTEC) 10 MG tablet Take 1 tablet by mouth daily as needed for allergies.  Patient not taking: Reported on 08/01/2022    [provider]  clonazePAM (KLONOPIN) 0.5 MG tablet Take by mouth. 10/26/18 11/25/18  [provider]  letrozole ([place]) 2.5 MG tablet TAKE ONE TABLET BY MOUTH EVERY DAY Patient not taking: Reported on 08/01/2022 09/06/18   Lloyd Huger, MD  rosuvastatin (CRESTOR) 20 MG tablet Take 20 mg by mouth daily. 09/18/17 09/18/18  [provider]    Allergies as of 07/13/2022   (No Known Allergies)    Family History  Problem Relation Age of Onset   Breast cancer Neg Hx     Social History   Socioeconomic History   Marital status: Married    Spouse name: Not on file   Number of children: Not on file   Years of education: Not on file   Highest education level: Not on file  Occupational History   Not on file  Tobacco Use   Smoking status: Never   Smokeless tobacco: Never  Vaping Use   Vaping Use: Never used  Substance and Sexual Activity   Alcohol use: Yes    Comment: occasional   Drug use: No   Sexual activity: Yes  Other Topics Concern   Not on file  Social History Narrative   Not on file   Social Determinants of Health   Financial Resource Strain: Low Risk  (10/15/2017)   Overall Financial Resource Strain (CARDIA)    Difficulty of Paying Living Expenses: Not hard at all  Food Insecurity: No Food Insecurity (10/15/2017)   Hunger Vital Sign    Worried About Running Out of Food in the Last Year: Never true    Ran Out of Food in the Last Year: Never true  Transportation Needs: No Transportation Needs (10/15/2017)   PRAPARE - Hydrologist (Medical): No    Lack of Transportation (Non-Medical): No  Physical Activity: Inactive (10/15/2017)    Exercise Vital Sign    Days of Exercise per Week: 0 days    Minutes of Exercise per Session: 0 min  Stress: No Stress Concern Present (10/15/2017)   [place]    Feeling of Stress : Not at all  Social Connections: Unknown (10/15/2017)   Social Connection and Isolation Panel [NHANES]    Frequency of Communication with Friends and Family: Never    Frequency of Social Gatherings with Friends and Family: Never    Attends Religious Services: Never    Marine scientist or Organizations: Patient declined    Attends Archivist Meetings: Patient declined    Marital Status: Patient declined  Intimate Partner Violence: Unknown (10/15/2017)   Humiliation, Afraid, Rape, and Kick questionnaire    Fear of Current or Ex-Partner: Patient declined    Emotionally Abused: Patient declined    Physically Abused: Patient declined    Sexually Abused: Patient declined    Review of Systems: See HPI, otherwise negative ROS  Physical Exam: BP (!) 167/97   Pulse 64   Temp (!) 97.2 F (36.2 C) (Temporal)   Resp 16   Ht 5\' 3"  (1.6 m)   Wt 90.6 kg   SpO2 98%   BMI 35.39 kg/m  General:   Alert,  pleasant and cooperative in NAD Head:  Normocephalic and atraumatic. Lungs:  Clear to auscultation.    Heart:  Regular rate and rhythm.   Impression/Plan: Jill Reilly is here for ophthalmic surgery.  Risks, benefits, limitations, and alternatives regarding ophthalmic surgery have been reviewed with the patient.  Questions have been answered.  All parties agreeable.   Leandrew Koyanagi, MD  08/10/2022, 11:48 AM

## 2022-08-10 NOTE — Anesthesia Postprocedure Evaluation (Signed)
Anesthesia Post Note  Patient: Jill Reilly  Procedure(s) Performed: CATARACT EXTRACTION PHACO AND INTRAOCULAR LENS PLACEMENT (IOC) LEFT  CLAREON TORIC LENS  6.33  00:54.6 (Left: Eye)  Patient location during evaluation: PACU Anesthesia Type: General Level of consciousness: awake and alert Pain management: pain level controlled Vital Signs Assessment: post-procedure vital signs reviewed and stable Respiratory status: spontaneous breathing, nonlabored ventilation, respiratory function stable and patient connected to nasal cannula oxygen Cardiovascular status: blood pressure returned to baseline and stable Postop Assessment: no apparent nausea or vomiting Anesthetic complications: no   No notable events documented.   Last Vitals:  Vitals:   08/10/22 1259 08/10/22 1304  BP: 119/78 (!) 149/98  Pulse: 61 (!) 58  Resp: 18 18  Temp: 37.1 C 37.1 C  SpO2: 97% 97%    Last Pain:  Vitals:   08/10/22 1304  TempSrc:   PainSc: 0-No pain                 Precious Haws Cardale Dorer

## 2022-08-10 NOTE — Op Note (Signed)
LOCATION:  [place]   PREOPERATIVE DIAGNOSIS:  Nuclear sclerotic cataract of the left eye.  H25.12  POSTOPERATIVE DIAGNOSIS:  Nuclear sclerotic cataract of the left eye.   PROCEDURE:  Phacoemulsification with Toric posterior chamber intraocular lens placement of the left eye.  Ultrasound time: Procedure(s): CATARACT EXTRACTION PHACO AND INTRAOCULAR LENS PLACEMENT (IOC) LEFT  CLAREON TORIC LENS  6.33  00:54.6 (Left)  LENS:   Implant Name Type Inv. Item Serial No. Manufacturer Lot No. LRB No. Used Action  CLAREON TORIC IOL Intraocular Lens  [id number]  Left 1 Implanted     [id number] Toric intraocular lens with 2.25 diopters of cylindrical power with axis orientation at 180 degrees.     SURGEON:  Wyonia Hough, MD   ANESTHESIA:  Topical with tetracaine drops and 2% Xylocaine jelly, augmented with 1% preservative-free intracameral lidocaine.  COMPLICATIONS:  None.   DESCRIPTION OF PROCEDURE:  The patient was identified in the holding room and transported to the operating suite and placed in the supine position under the operating microscope.  The left eye was identified as the operative eye, and it was prepped and draped in the usual sterile ophthalmic fashion.    A clear-corneal paracentesis incision was made at the 1:30 position.  0.5 ml of preservative-free 1% lidocaine was injected into the anterior chamber. The anterior chamber was filled with Viscoat.  A 2.4 millimeter near clear corneal incision was then made at the 10:30 position.  A cystotome and capsulorrhexis forceps were then used to make a curvilinear capsulorrhexis.  Hydrodissection and hydrodelineation were then performed using balanced salt solution.   Phacoemulsification was then used in stop and chop fashion to remove the lens, nucleus and epinucleus.  The remaining cortex was aspirated using the irrigation and aspiration handpiece.  Provisc viscoelastic was then placed into the capsular bag to  distend it for lens placement.  The Verion digital marker was used to align the implant at the intended axis.   A Toric lens was then injected into the capsular bag.  It was rotated clockwise until the axis marks on the lens were approximately 15 degrees in the counterclockwise direction to the intended alignment.  The viscoelastic was aspirated from the eye using the irrigation aspiration handpiece.  Then, a Koch spatula through the sideport incision was used to rotate the lens in a clockwise direction until the axis markings of the intraocular lens were lined up with the Verion alignment.  Balanced salt solution was then used to hydrate the wounds. Cefuroxime 0.1 ml of a 10mg /ml solution was injected into the anterior chamber for a dose of 1 mg of intracameral antibiotic at the completion of the case.    The eye was noted to have a physiologic pressure and there was no wound leak noted.   Timolol and Brimonidine drops were applied to the eye.  The patient was taken to the recovery room in stable condition having had no complications of anesthesia or surgery.  Rechy Bost 08/10/2022, 12:59 PM

## 2022-08-11 ENCOUNTER — Encounter: Payer: Self-pay | Admitting: Ophthalmology

## 2022-08-18 ENCOUNTER — Encounter: Payer: Self-pay | Admitting: Ophthalmology

## 2022-08-25 NOTE — Anesthesia Preprocedure Evaluation (Addendum)
Anesthesia Evaluation  Patient identified by MRN, date of birth, ID band Patient awake    Reviewed: Allergy & Precautions, H&P , NPO status , Patient's Chart, lab work & pertinent test results, reviewed documented beta blocker date and time   Airway Mallampati: I  TM Distance: <3 FB Neck ROM: Full    Dental no notable dental hx.    Pulmonary neg pulmonary ROS  Note nose piercing left side Pulmonary exam normal breath sounds clear to auscultation       Cardiovascular negative cardio ROS Normal cardiovascular exam Rhythm:Regular Rate:Normal  Patient is on propranolol (inderal)   Neuro/Psych  Headaches  Anxiety     negative neurological ROS  negative psych ROS   GI/Hepatic Neg liver ROS,GERD  Controlled,,  Endo/Other  Obesity BMI 35.61  Renal/GU negative Renal ROS  negative genitourinary   Musculoskeletal negative musculoskeletal ROS (+) Arthritis , Osteoarthritis,    Abdominal   Peds negative pediatric ROS (+)  Hematology negative hematology ROS (+)   Anesthesia Other Findings Note nose piercing left side  Reproductive/Obstetrics negative OB ROS                             Anesthesia Physical Anesthesia Plan  ASA: 2  Anesthesia Plan: MAC   Post-op Pain Management:    Induction: Intravenous  PONV Risk Score and Plan:   Airway Management Planned: Natural Airway and Nasal Cannula  Additional Equipment:   Intra-op Plan:   Post-operative Plan:   Informed Consent: I have reviewed the patients History and Physical, chart, labs and discussed the procedure including the risks, benefits and alternatives for the proposed anesthesia with the patient or authorized representative who has indicated his/her understanding and acceptance.     Dental Advisory Given  Plan Discussed with: Anesthesiologist, CRNA and Surgeon  Anesthesia Plan Comments: (Patient consented for risks of anesthesia  including but not limited to:  - adverse reactions to medications - damage to eyes, teeth, lips or other oral mucosa - nerve damage due to positioning  - sore throat or hoarseness - Damage to heart, brain, nerves, lungs, other parts of body or loss of life  Patient voiced understanding.)       Anesthesia Quick Evaluation

## 2022-08-29 NOTE — Discharge Instructions (Signed)

## 2022-08-31 ENCOUNTER — Encounter
Admission: RE | Disposition: A | Discharge status: Home / Self Care | Payer: Self-pay | Source: Home / Self Care | Attending: Ophthalmology

## 2022-08-31 ENCOUNTER — Ambulatory Visit
Admission: RE | Admit: 2022-08-31 | Discharge: 2022-08-31 | Disposition: A | Discharge status: Home / Self Care | Payer: Medicare HMO | Attending: Ophthalmology | Admitting: Ophthalmology

## 2022-08-31 ENCOUNTER — Ambulatory Visit: Payer: Medicare HMO | Admitting: Anesthesiology

## 2022-08-31 ENCOUNTER — Other Ambulatory Visit: Payer: Self-pay

## 2022-08-31 DIAGNOSIS — H2511 Age-related nuclear cataract, right eye: Secondary | ICD-10-CM | POA: Diagnosis present

## 2022-08-31 DIAGNOSIS — F419 Anxiety disorder, unspecified: Secondary | ICD-10-CM | POA: Diagnosis not present

## 2022-08-31 HISTORY — PX: CATARACT EXTRACTION W/PHACO: SHX586

## 2022-08-31 SURGERY — PHACOEMULSIFICATION, CATARACT, WITH IOL INSERTION
Anesthesia: Monitor Anesthesia Care | Site: Eye | Laterality: Right

## 2022-08-31 MED ORDER — SIGHTPATH DOSE#1 NA HYALUR & NA CHOND-NA HYALUR IO KIT
PACK | INTRAOCULAR | Status: DC | PRN
  Administered 2022-08-31: 1 via OPHTHALMIC

## 2022-08-31 MED ORDER — SIGHTPATH DOSE#1 BSS IO SOLN
INTRAOCULAR | Status: DC | PRN
  Administered 2022-08-31: 62 mL via OPHTHALMIC

## 2022-08-31 MED ORDER — CEFUROXIME OPHTHALMIC INJECTION 1 MG/0.1 ML
INJECTION | OPHTHALMIC | Status: DC | PRN
  Administered 2022-08-31: .1 mL via INTRACAMERAL

## 2022-08-31 MED ORDER — FENTANYL CITRATE (PF) 100 MCG/2ML IJ SOLN
INTRAMUSCULAR | Status: DC | PRN
  Administered 2022-08-31 (×2): 50 ug via INTRAVENOUS

## 2022-08-31 MED ORDER — LACTATED RINGERS IV SOLN: INTRAVENOUS | Status: DC

## 2022-08-31 MED ORDER — MIDAZOLAM HCL 2 MG/2ML IJ SOLN
INTRAMUSCULAR | Status: DC | PRN
  Administered 2022-08-31: 2 mg via INTRAVENOUS

## 2022-08-31 MED ORDER — ARMC OPHTHALMIC DILATING DROPS
1.0000 | OPHTHALMIC | Status: DC | PRN
  Administered 2022-08-31 (×3): 1 via OPHTHALMIC

## 2022-08-31 MED ORDER — TETRACAINE HCL 0.5 % OP SOLN
1.0000 [drp] | OPHTHALMIC | Status: DC | PRN
  Administered 2022-08-31 (×3): 1 [drp] via OPHTHALMIC

## 2022-08-31 MED ORDER — BRIMONIDINE TARTRATE-TIMOLOL 0.2-0.5 % OP SOLN
OPHTHALMIC | Status: DC | PRN
  Administered 2022-08-31: 1 [drp] via OPHTHALMIC

## 2022-08-31 MED ORDER — SIGHTPATH DOSE#1 BSS IO SOLN
INTRAOCULAR | Status: DC | PRN
  Administered 2022-08-31: 2 mL

## 2022-08-31 MED ORDER — SIGHTPATH DOSE#1 BSS IO SOLN
INTRAOCULAR | Status: DC | PRN
  Administered 2022-08-31: 15 mL via INTRAOCULAR

## 2022-08-31 SURGICAL SUPPLY — 20 items
CANNULA ANT/CHMB 27G (MISCELLANEOUS) IMPLANT
CANNULA ANT/CHMB 27GA (MISCELLANEOUS) IMPLANT
CATARACT SUITE SIGHTPATH (MISCELLANEOUS) ×1 IMPLANT
FEE CATARACT SUITE SIGHTPATH (MISCELLANEOUS) ×1 IMPLANT
GLOVE SRG 8 PF TXTR STRL LF DI (GLOVE) ×1 IMPLANT
GLOVE SURG ENC TEXT LTX SZ7.5 (GLOVE) ×1 IMPLANT
GLOVE SURG GAMMEX PI TX LF 7.5 (GLOVE) IMPLANT
GLOVE SURG UNDER POLY LF SZ8 (GLOVE) ×1
LENS CLAREON TORIC CNW0T4 14.0 ×1 IMPLANT
LENS IOL CLRN TRC 4 14.0 IMPLANT
NDL FILTER BLUNT 18X1 1/2 (NEEDLE) ×1 IMPLANT
NDL RETROBULBAR .5 NSTRL (NEEDLE) IMPLANT
NEEDLE FILTER BLUNT 18X1 1/2 (NEEDLE) ×1 IMPLANT
PACK VIT ANT 23G (MISCELLANEOUS) IMPLANT
RING MALYGIN 7.0 (MISCELLANEOUS) IMPLANT
SUT ETHILON 10-0 CS-B-6CS-B-6 (SUTURE)
SUT VICRYL  9 0 (SUTURE)
SUT VICRYL 9 0 (SUTURE) IMPLANT
SUTURE EHLN 10-0 CS-B-6CS-B-6 (SUTURE) IMPLANT
SYR 3ML LL SCALE MARK (SYRINGE) ×1 IMPLANT

## 2022-08-31 NOTE — Anesthesia Postprocedure Evaluation (Signed)
Anesthesia Post Note  Patient: Miray E Darrow  Procedure(s) Performed: CATARACT EXTRACTION PHACO AND INTRAOCULAR LENS PLACEMENT (IOC) RIGHT CLAREON TORIC 7.78 00:51.5 (Right: Eye)  Patient location during evaluation: PACU Anesthesia Type: MAC Level of consciousness: awake and alert Pain management: pain level controlled Vital Signs Assessment: post-procedure vital signs reviewed and stable Respiratory status: spontaneous breathing, nonlabored ventilation, respiratory function stable and patient connected to nasal cannula oxygen Cardiovascular status: stable and blood pressure returned to baseline Postop Assessment: no apparent nausea or vomiting Anesthetic complications: no   No notable events documented.   Last Vitals:  Vitals:   08/31/22 1011 08/31/22 1015  BP: 123/77 116/81  Pulse: (!) 59 (!) 58  Resp: 16 12  Temp: (!) 36.2 C (!) 36.2 C  SpO2: 95% 95%    Last Pain:  Vitals:   08/31/22 1015  TempSrc:   PainSc: 0-No pain                 Jaymeson Mengel C Louine Tenpenny

## 2022-08-31 NOTE — Op Note (Signed)
LOCATION:  [facility]   PREOPERATIVE DIAGNOSIS:  Nuclear sclerotic cataract of the right eye.  H25.11   POSTOPERATIVE DIAGNOSIS:  Nuclear sclerotic cataract of the right eye.   PROCEDURE:  Phacoemulsification with Toric posterior chamber intraocular lens placement of the right eye.  Ultrasound time: Procedure(s): CATARACT EXTRACTION PHACO AND INTRAOCULAR LENS PLACEMENT (IOC) RIGHT CLAREON TORIC 7.78 00:51.5 (Right)  LENS:   Implant Name Type Inv. Item Serial No. Manufacturer Lot No. LRB No. Used Action  Clareon Toric Aspheric Hydrophobic Acrylic IOL 14.0 Intraocular Lens  [id number] ALCON  Right 1 Implanted     [id number] 14.0 Toric intraocular lens with 2.25 diopters of cylindrical power with axis orientation at 177 degrees.   SURGEON:  Deirdre Evener, MD   ANESTHESIA: Topical with tetracaine drops and 2% Xylocaine jelly, augmented with 1% preservative-free intracameral lidocaine. .   COMPLICATIONS:  None.   DESCRIPTION OF PROCEDURE:  The patient was identified in the holding room and transported to the operating suite and placed in the supine position under the operating microscope.  The right eye was identified as the operative eye, and it was prepped and draped in the usual sterile ophthalmic fashion.    A clear-corneal paracentesis incision was made at the 12:00 position.  0.5 ml of preservative-free 1% lidocaine was injected into the anterior chamber. The anterior chamber was filled with Viscoat.  A 2.4 millimeter near clear corneal incision was then made at the 9:00 position.  A cystotome and capsulorrhexis forceps were then used to make a curvilinear capsulorrhexis.  Hydrodissection and hydrodelineation were then performed using balanced salt solution.   Phacoemulsification was then used in stop and chop fashion to remove the lens, nucleus and epinucleus.  The remaining cortex was aspirated using the irrigation and aspiration handpiece.  Provisc viscoelastic was  then placed into the capsular bag to distend it for lens placement.  The Verion digital marker was used to align the implant at the intended axis.   A Toric lens was then injected into the capsular bag.  It was rotated clockwise until the axis marks on the lens were approximately 15 degrees in the counterclockwise direction to the intended alignment.  The viscoelastic was aspirated from the eye using the irrigation aspiration handpiece.  Then, a Koch spatula through the sideport incision was used to rotate the lens in a clockwise direction until the axis markings of the intraocular lens were lined up with the Verion alignment.  Balanced salt solution was then used to hydrate the wounds. Cefuroxime 0.1 ml of a 10mg /ml solution was injected into the anterior chamber for a dose of 1 mg of intracameral antibiotic at the completion of the case.    The eye was noted to have a physiologic pressure and there was no wound leak noted.   Timolol and Brimonidine drops were applied to the eye.  The patient was taken to the recovery room in stable condition having had no complications of anesthesia or surgery.  Charls Custer 08/31/2022, 10:10 AM

## 2022-08-31 NOTE — H&P (Signed)
[facility]   Primary Care Physician:  Lauro Regulus, MD Ophthalmologist: Dr. Lockie Mola  Pre-Procedure History & Physical: HPI:  Jill Reilly is a 67 y.o. female here for ophthalmic surgery.   Past Medical History:  Diagnosis Date   Anxiety    Arthritis    FINGERS   Cancer (HCC) 03/2016   left breast   GERD (gastroesophageal reflux disease)    Headache    H/O MIGRAINES   Personal history of radiation therapy 2017/2018   left breast ca    Past Surgical History:  Procedure Laterality Date   BILATERAL CARPAL TUNNEL RELEASE     BREAST BIOPSY Left 03/31/2016   invasive mammary   BREAST LUMPECTOMY Left 04/29/2016   invasive mammary carcinoma. Clear margins   CATARACT EXTRACTION W/PHACO Left 08/10/2022   Procedure: CATARACT EXTRACTION PHACO AND INTRAOCULAR LENS PLACEMENT (IOC) LEFT  CLAREON TORIC LENS  6.33  00:54.6;  Surgeon: Lockie Mola, MD;  Location: [facility];  Service: Ophthalmology;  Laterality: Left;   CESAREAN SECTION     CHOLECYSTECTOMY     PARTIAL MASTECTOMY WITH NEEDLE LOCALIZATION Left 04/29/2016   Procedure: PARTIAL MASTECTOMY WITH NEEDLE LOCALIZATION;  Surgeon: Nadeen Landau, MD;  Location: [facility];  Service: General;  Laterality: Left;   SENTINEL NODE BIOPSY Left 04/29/2016   Procedure: SENTINEL NODE BIOPSY;  Surgeon: Nadeen Landau, MD;  Location: [facility];  Service: General;  Laterality: Left;    Prior to Admission medications   Medication Sig Start Date End Date Taking? Authorizing Provider  acetaminophen (TYLENOL) 500 MG tablet Take 1,000 mg by mouth every 6 (six) hours as needed for moderate pain or headache.   Yes [provider]  aspirin EC 81 MG tablet Take 81 mg by mouth daily.   Yes [provider]  Ca Carbonate-Mag Hydroxide (ROLAIDS PO) Take 1 tablet by mouth as needed ((stomach acid)).    Yes [provider]  cetirizine (ZYRTEC) 10 MG tablet Take 1 tablet by mouth  daily as needed for allergies.   Yes [provider]  Cyanocobalamin (VITAMIN B 12) 500 MCG TABS Take 1,000 1e11 Vector Genomes by mouth 1 day or 1 dose.   Yes [provider]  fluticasone (FLONASE) 50 MCG/ACT nasal spray Place 1 spray into both nostrils daily.   Yes [provider]  ibuprofen (ADVIL,MOTRIN) 200 MG tablet Take 400 mg by mouth every 6 (six) hours as needed for headache or moderate pain.   Yes [provider]  letrozole (FEMARA) 2.5 MG tablet TAKE ONE TABLET BY MOUTH EVERY DAY 09/06/18  Yes Jeralyn Ruths, MD  propranolol (INDERAL) 40 MG tablet Take 20 mg by mouth 1 day or 1 dose.   Yes [provider]  acidophilus (RISAQUAD) CAPS capsule Take by mouth daily. Patient not taking: Reported on 08/18/2022    [provider]  azelastine (ASTELIN) 0.1 % nasal spray Place into the nose. 06/26/18 06/26/19  [provider]  clonazePAM (KLONOPIN) 0.5 MG tablet Take by mouth. 10/26/18 11/25/18  [provider]  rosuvastatin (CRESTOR) 20 MG tablet Take 20 mg by mouth daily. 09/18/17 09/18/18  [provider]    Allergies as of 08/17/2022   (No Known Allergies)    Family History  Problem Relation Age of Onset   Breast cancer Neg Hx     Social History   Socioeconomic History   Marital status: Married    Spouse name: Not on file   Number  of children: Not on file   Years of education: Not on file   Highest education level: Not on file  Occupational History   Not on file  Tobacco Use   Smoking status: Never   Smokeless tobacco: Never  Vaping Use   Vaping Use: Never used  Substance and Sexual Activity   Alcohol use: Yes    Comment: occasional   Drug use: No   Sexual activity: Yes  Other Topics Concern   Not on file  Social History Narrative   Not on file   Social Determinants of Health   Financial Resource Strain: Low Risk  (10/15/2017)   Overall Financial Resource Strain (CARDIA)    Difficulty of  Paying Living Expenses: Not hard at all  Food Insecurity: No Food Insecurity (10/15/2017)   Hunger Vital Sign    Worried About Running Out of Food in the Last Year: Never true    Ran Out of Food in the Last Year: Never true  Transportation Needs: No Transportation Needs (10/15/2017)   PRAPARE - Administrator, Civil Service (Medical): No    Lack of Transportation (Non-Medical): No  Physical Activity: Inactive (10/15/2017)   Exercise Vital Sign    Days of Exercise per Week: 0 days    Minutes of Exercise per Session: 0 min  Stress: No Stress Concern Present (10/15/2017)   [employer] of Occupational Health - Occupational Stress Questionnaire    Feeling of Stress : Not at all  Social Connections: Unknown (10/15/2017)   Social Connection and Isolation Panel [NHANES]    Frequency of Communication with Friends and Family: Never    Frequency of Social Gatherings with Friends and Family: Never    Attends Religious Services: Never    Database administrator or Organizations: Patient declined    Attends Banker Meetings: Patient declined    Marital Status: Patient declined  Intimate Partner Violence: Unknown (10/15/2017)   Humiliation, Afraid, Rape, and Kick questionnaire    Fear of Current or Ex-Partner: Patient declined    Emotionally Abused: Patient declined    Physically Abused: Patient declined    Sexually Abused: Patient declined    Review of Systems: See HPI, otherwise negative ROS  Physical Exam: BP (!) 160/103   Pulse (!) 57   Temp (!) 97.2 F (36.2 C) (Temporal)   Resp 16   Wt 91.2 kg   SpO2 98%   BMI 35.61 kg/m  General:   Alert,  pleasant and cooperative in NAD Head:  Normocephalic and atraumatic. Lungs:  Clear to auscultation.    Heart:  Regular rate and rhythm.   Impression/Plan: Jill Reilly is here for ophthalmic surgery.  Risks, benefits, limitations, and alternatives regarding ophthalmic surgery have been reviewed with the patient.   Questions have been answered.  All parties agreeable.   Lockie Mola, MD  08/31/2022, 9:29 AM

## 2022-08-31 NOTE — Transfer of Care (Signed)
Immediate Anesthesia Transfer of Care Note  Patient: Jill Reilly  Procedure(s) Performed: CATARACT EXTRACTION PHACO AND INTRAOCULAR LENS PLACEMENT (IOC) RIGHT CLAREON TORIC 7.78 00:51.5 (Right: Eye)  Patient Location: PACU  Anesthesia Type: MAC  Level of Consciousness: awake, alert  and patient cooperative  Airway and Oxygen Therapy: Patient Spontanous Breathing and Patient connected to supplemental oxygen  Post-op Assessment: Post-op Vital signs reviewed, Patient's Cardiovascular Status Stable, Respiratory Function Stable, Patent Airway and No signs of Nausea or vomiting  Post-op Vital Signs: Reviewed and stable  Complications: No notable events documented.

## 2022-09-02 ENCOUNTER — Encounter: Payer: Self-pay | Admitting: Ophthalmology

## 2023-02-08 ENCOUNTER — Ambulatory Visit
Admission: EM | Admit: 2023-02-08 | Discharge: 2023-02-08 | Disposition: A | Discharge status: Home / Self Care | Payer: Medicare HMO | Attending: Family Medicine | Admitting: Family Medicine

## 2023-02-08 ENCOUNTER — Ambulatory Visit (INDEPENDENT_AMBULATORY_CARE_PROVIDER_SITE_OTHER): Payer: Medicare HMO

## 2023-02-08 DIAGNOSIS — B9789 Other viral agents as the cause of diseases classified elsewhere: Secondary | ICD-10-CM | POA: Diagnosis not present

## 2023-02-08 DIAGNOSIS — J069 Acute upper respiratory infection, unspecified: Secondary | ICD-10-CM | POA: Insufficient documentation

## 2023-02-08 DIAGNOSIS — R0602 Shortness of breath: Secondary | ICD-10-CM | POA: Diagnosis not present

## 2023-02-08 DIAGNOSIS — Z1152 Encounter for screening for COVID-19: Secondary | ICD-10-CM | POA: Insufficient documentation

## 2023-02-08 DIAGNOSIS — R059 Cough, unspecified: Secondary | ICD-10-CM | POA: Diagnosis present

## 2023-02-08 LAB — RESP PANEL BY RT-PCR (RSV, FLU A&B, COVID)  RVPGX2
Influenza A by PCR: NEGATIVE
Influenza B by PCR: NEGATIVE
Resp Syncytial Virus by PCR: NEGATIVE
SARS Coronavirus 2 by RT PCR: NEGATIVE

## 2023-02-08 MED ORDER — ALBUTEROL SULFATE HFA 108 (90 BASE) MCG/ACT IN AERS: 2.0000 | INHALATION_SPRAY | RESPIRATORY_TRACT | 0 refills | Status: DC | PRN

## 2023-02-08 MED ORDER — PROMETHAZINE-DM 6.25-15 MG/5ML PO SYRP: 5.0000 mL | ORAL_SOLUTION | Freq: Four times a day (QID) | ORAL | 0 refills | Status: DC | PRN

## 2023-02-08 MED ORDER — BENZONATATE 100 MG PO CAPS: 100.0000 mg | ORAL_CAPSULE | Freq: Three times a day (TID) | ORAL | 0 refills | Status: DC

## 2023-02-08 MED ORDER — PREDNISONE 10 MG (21) PO TBPK: ORAL_TABLET | Freq: Every day | ORAL | 0 refills | Status: DC

## 2023-02-08 NOTE — ED Triage Notes (Signed)
Sx started Sat  Cough-chills-sob-headache.

## 2023-02-08 NOTE — Discharge Instructions (Addendum)
Your COVID, flu, RSV test are all normal.  Your chest x-ray did not show evidence of pneumonia. The radiologist has not yet read your xray. If it is significantly abnormal or urgent, someone will contact you.  You should see your results in MyChart.   Stop by the pharmacy to pick up your prescriptions.  Follow up with your primary care provider as needed.

## 2023-02-08 NOTE — ED Provider Notes (Signed)
[facility]    CSN: [id number] Arrival date & time: 02/08/23  0907      History   Chief Complaint Chief Complaint  Patient presents with   Cough   Shortness of Breath   Fever    HPI Jill Reilly is a 67 y.o. female.   HPI  History obtained from the patient. Jill Reilly presents for cough, chills, shortness of breath, headache that started on Saturday. Took some Dayquil and Delsym. She didn't take her temperature but felt warm.  She has a productive cough and chest discomfort. No known sick contacts.  No vomiting, diarrhea or ear pain.  Had a scratchy throat but this is mostly resolved.  Her husband is well and she states she does not leave the home.       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 Clear margins   CATARACT EXTRACTION W/PHACO Left 08/10/2022   Procedure: CATARACT EXTRACTION PHACO AND INTRAOCULAR LENS PLACEMENT (IOC) LEFT  CLAREON TORIC LENS  6.33  00:54.6;  Surgeon: Lockie Mola, MD;  Location: [facility];  Service: Ophthalmology;  Laterality: Left;   CATARACT EXTRACTION W/PHACO Right 08/31/2022   Procedure: CATARACT EXTRACTION PHACO AND INTRAOCULAR LENS PLACEMENT (IOC) RIGHT CLAREON TORIC 7.78 00:51.5;  Surgeon: Lockie Mola, MD;  Location: [facility];  Service: Ophthalmology;  Laterality: Right;   CESAREAN SECTION     CHOLECYSTECTOMY      PARTIAL MASTECTOMY WITH NEEDLE LOCALIZATION Left 04/29/2016   Procedure: PARTIAL MASTECTOMY WITH NEEDLE LOCALIZATION;  Surgeon: Nadeen Landau, MD;  Location: [facility];  Service: General;  Laterality: Left;   SENTINEL NODE BIOPSY Left 04/29/2016   Procedure: SENTINEL NODE BIOPSY;  Surgeon: Nadeen Landau, MD;  Location: [facility];  Service: General;  Laterality: Left;    OB History   No obstetric history on file.      Home Medications    Prior to Admission medications   Medication Sig Start Date End Date Taking? Authorizing Provider  albuterol (VENTOLIN HFA) 108 (90 Base) MCG/ACT inhaler Inhale 2 puffs into the lungs every 4 (four) hours as needed. 02/08/23  Yes Ridge Lafond, DO  benzonatate (TESSALON) 100 MG capsule Take 1 capsule (100 mg total) by mouth every 8 (eight) hours. 02/08/23  Yes Sherhonda Gaspar, DO  predniSONE (STERAPRED UNI-PAK 21 TAB) 10 MG (21) TBPK tablet Take by mouth daily. Take 6 tabs by mouth daily for 1, then 5 tabs for 1 day, then 4 tabs for 1 day, then 3 tabs for 1 day, then 2 tabs for 1 day, then 1 tab for 1 day. 02/08/23  Yes Nicco Reaume, DO  promethazine-dextromethorphan (PROMETHAZINE-DM) 6.25-15 MG/5ML syrup Take 5 mLs by mouth 4 (four) times daily as needed. 02/08/23  Yes Leotis Isham, DO  propranolol (INDERAL) 40 MG tablet Take 20 mg by  mouth 1 day or 1 dose.   Yes [provider]  acetaminophen (TYLENOL) 500 MG tablet Take 1,000 mg by mouth every 6 (six) hours as needed for moderate pain or headache.    [provider]  acidophilus (RISAQUAD) CAPS capsule Take by mouth daily. Patient not taking: Reported on 08/18/2022    [provider]  aspirin EC 81 MG tablet Take 81 mg by mouth daily.    [provider]  azelastine (ASTELIN) 0.1 % nasal spray Place into the nose. 06/26/18 06/26/19  [provider]  Ca Carbonate-Mag Hydroxide (ROLAIDS PO) Take 1 tablet by mouth as needed ((stomach acid)).     [provider]  cetirizine (ZYRTEC) 10 MG tablet Take 1 tablet by mouth daily as needed for allergies.    [provider]  clonazePAM (KLONOPIN) 0.5 MG tablet Take by mouth. 10/26/18 11/25/18  [provider]  Cyanocobalamin (VITAMIN B 12) 500 MCG TABS Take 1,000 1e11 Vector Genomes by mouth 1 day or 1 dose.    [provider]  fluticasone (FLONASE) 50 MCG/ACT nasal spray Place 1 spray into both nostrils daily.    [provider]  ibuprofen (ADVIL,MOTRIN) 200 MG tablet Take 400 mg by mouth every 6 (six) hours as needed for headache or moderate pain.    [provider]  letrozole (FEMARA) 2.5 MG tablet TAKE ONE TABLET BY MOUTH EVERY DAY 09/06/18   Jeralyn Ruths, MD  rosuvastatin (CRESTOR) 20 MG tablet Take 20 mg by mouth daily. 09/18/17 09/18/18  [provider]    Family History Family History  Problem Relation Age of Onset   Breast cancer Neg Hx     Social History Social History   Tobacco Use   Smoking status: Never   Smokeless tobacco: Never  Vaping Use   Vaping status: Never Used  Substance Use Topics   Alcohol use: Yes    Comment: occasional   Drug use: No     Allergies   Patient has no known allergies.   Review of Systems Review of Systems: negative unless otherwise stated in HPI.      Physical Exam Triage Vital Signs ED Triage Vitals  Encounter Vitals Group     BP 02/08/23 0948 128/77     Systolic BP Percentile --      Diastolic BP Percentile --      Pulse Rate 02/08/23 0948 86     Resp 02/08/23 0948 19     Temp 02/08/23 0948 98.7 F (37.1 C)     Temp Source 02/08/23 0948 Oral     SpO2 02/08/23 0948 95 %     Weight 02/08/23 0947 200 lb (90.7 kg)     Height --      Head Circumference --      Peak Flow --      Pain Score 02/08/23 0947 0     Pain Loc --      Pain Education --      Exclude from Growth Chart --    No data found.  Updated Vital Signs BP 128/77 (BP Location: Right Arm)   Pulse 86    Temp 98.7 F (37.1 C) (Oral)   Resp 19   Wt 90.7 kg   SpO2 95%   BMI 35.43 kg/m   Visual Acuity Right Eye Distance:   Left Eye Distance:   Bilateral Distance:    Right Eye Near:   Left Eye Near:    Bilateral Near:  Physical Exam GEN:     alert, ill but non-toxic appearing female in no distress    HENT:  mucus membranes moist, oropharyngeal without lesions or erythema, no nasal discharge EYES:   pupils equal and reactive, no scleral injection or discharge NECK:  normal ROM, no meningismus   RESP:  no increased work of breathing, scattered expiratory wheezing, with coarse breath sounds diffusely CVS:   regular rate and rhythm Skin:   warm and dry    UC Treatments / Results  Labs (all labs ordered are listed, but only abnormal results are displayed) Labs Reviewed  RESP PANEL BY RT-PCR (RSV, FLU A&B, COVID)  [id number]    EKG   Radiology DG Chest 2 View  Result Date: 02/08/2023 CLINICAL DATA:  Cough, shortness of breath, chest pain EXAM: CHEST - 2 VIEW COMPARISON:  None Available. FINDINGS: Lungs are clear.  No pneumothorax. Heart size and mediastinal contours are within normal limits. No effusion. Visualized bones unremarkable. IMPRESSION: No acute cardiopulmonary disease. Electronically Signed   By: Corlis Leak M.D.   On: 02/08/2023 11:13    Procedures Procedures (including critical care time)  Medications Ordered in UC Medications - No data to display  Initial Impression / Assessment and Plan / UC Course  I have reviewed the triage vital signs and the nursing notes.  Pertinent labs & imaging results that were available during my care of the patient were reviewed by me and considered in my medical decision making (see chart for details).       Pt is a 67 y.o. female who presents for 4 days of respiratory symptoms. Jill Reilly is afebrile here. Satting 95% on room air. Overall pt is ill but non-toxic appearing, well hydrated, without respiratory distress. Pulmonary exam  is remarkable for coarse breath sounds diffusely and scattered expiratory wheezing.  COVID, RSV and influenza testing obtained. Chest xray personally reviewed by me without focal pneumonia, pleural effusion, cardiomegaly or pneumothorax. Patient aware the radiologist has not read her xray and is comfortable with the preliminary read by me. Will review radiologist read when available and call patient if a change in plan is warranted.  Pt agreeable to this plan prior to discharge.   History consistent with viral respiratory illness. Discussed symptomatic treatment.  Explained lack of efficacy of antibiotics in viral disease.  Typical duration of symptoms discussed.  Tessalon Perles and Promethazine DM for cough. Prednisone taper prescribed. Albuterol for bronchospasm.   Return and ED precautions given and voiced understanding. Discussed MDM, treatment plan and plan for follow-up with patient who agrees with plan.    Radiologist impression reviewed.    Final Clinical Impressions(s) / UC Diagnoses   Final diagnoses:  Viral URI with cough     Discharge Instructions      Your COVID, flu, RSV test are all normal.  Your chest x-ray did not show evidence of pneumonia. The radiologist has not yet read your xray. If it is significantly abnormal or urgent, someone will contact you.  You should see your results in MyChart.   Stop by the pharmacy to pick up your prescriptions.  Follow up with your primary care provider as needed.        ED Prescriptions     Medication Sig Dispense Auth. Provider   predniSONE (STERAPRED UNI-PAK 21 TAB) 10 MG (21) TBPK tablet Take by mouth daily. Take 6 tabs by mouth daily for 1, then 5 tabs for 1 day, then 4 tabs for 1 day, then 3 tabs  for 1 day, then 2 tabs for 1 day, then 1 tab for 1 day. 21 tablet Natallie Ravenscroft, DO   benzonatate (TESSALON) 100 MG capsule Take 1 capsule (100 mg total) by mouth every 8 (eight) hours. 21 capsule Brailon Don, DO    promethazine-dextromethorphan (PROMETHAZINE-DM) 6.25-15 MG/5ML syrup Take 5 mLs by mouth 4 (four) times daily as needed. 118 mL Sahira Cataldi, DO   albuterol (VENTOLIN HFA) 108 (90 Base) MCG/ACT inhaler Inhale 2 puffs into the lungs every 4 (four) hours as needed. 6.7 g Katha Cabal, DO      PDMP not reviewed this encounter.   Katha Cabal, DO 02/08/23 1148

## 2023-08-15 ENCOUNTER — Other Ambulatory Visit: Payer: Self-pay | Admitting: Internal Medicine

## 2023-08-15 DIAGNOSIS — Z1231 Encounter for screening mammogram for malignant neoplasm of breast: Secondary | ICD-10-CM

## 2023-08-15 NOTE — Progress Notes (Signed)
 MEDICARE WELLNESS VISIT   PROVIDERS RENDERING CARE Dr. Lenon   FUNCTIONAL ASSESSMENT  (1) Hearing: Demonstrates normal hearing in conversation.  (2) Risk of Falls: No reports of falls or abnormal balance. Gait is observed to be good upon observation.  (3) Home Safety; Home is safe and secure (4) Activities of Daily Living; Household chores and grooming are managed without problems. Personal finances are managed without problems.   DEPRESSION SCREENING There does not seem to be loss of interest in activities nor excess crying or changes in sleep or appetite.   COGNITIVE SCREENING Orientation is appropriate as are responses to questions and general conversation. No reports of forgetfulness or losing things.    PREVENTION PLAN Cardiovascular: cholesterol followed  Diabetes: Glucose yearly  Mammogram: Yearly  Bone Density: Vitamin d daily Colon Cancer: 6-17 with polyps and 12-23 with a polyp and due 5 years  Glaucoma: Yearly eye exam  Pneumonia: Pneumovax 12-20, prevnar 13 in 2-22 Shingles: Shingrix 2021  Covid;  Has been vaccinated Influenza: Yearly vaccine in the fall Smoking Cessation: NA   OTHER PERSONALIZED HEALTH ADVISE Frequent exercise and weight sustaining diet  END OF LIFE CARE WANTS Full code        Jill Lenon MD     [facility] 2/9 last 3 flowsheet values     07/14/2021    8:27 AM 08/11/2022    8:10 AM 08/15/2023    8:19 AM  PHQ-2/9 Depression Screening   Little interest or pleasure in doing things  0 0  Feeling down, depressed, or hopeless  0 0  Patient Health Questionnaire-2 Score  0 0  (OBSOLETE) Little interest or pleasure in doing things 0    (OBSOLETE) Feeling down, depressed, or hopeless (or irritable for Teens only)? 0    (OBSOLETE) Total Prescreening Score 0    (OBSOLETE) Total Score = 0       Depression Severity and Treatment Recommendations:  0-4= None  5-9= Mild / Treatment: Support, educate to call if worse; return in one month   10-14= Moderate / Treatment: Support, watchful waiting; Antidepressant or Psychotherapy  15-19= Moderately severe / Treatment: Antidepressant OR Psychotherapy  >= 20 = Major depression, severe / Antidepressant AND Psychotherapy  Please note approximately 15 minutes was spent and depression screening by me and nursing staff.     We discussed an exercise program and its benefits as well as a healthy diet including healthy fats and vegetables. These are very important parts of healthy living and cardiovascular health. 15 minutes was devoted to this.      Jill Reilly is a 68 y.o. female here for her annual exam with health maintenance    Chief Complaint  Annual exam   HISTORY OF PRESENT ILLNESS  OSA (obstructive sleep apnea) Continues to use cpap consistently and is continuing to benefit from it's use.    Anxiety, mild Rare prn benzo's have been used  Carotid artery disease (CMS-HCC) No new focal neuro symptoms on rosuvastatin    Controlled type 2 diabetes mellitus with complication, without long-term current use of insulin (CMS-HCC) Following a diabetic diet and medications are tolerated as appropriate.     Review Of Systems Constitutional; No weight loss, fever, chills, weakness  HEENT: No visual loss, blurred vision, hearing loss, ear pain, runny nose or sore throat SKIN; No rash or itching CARDIOVASCULAR; No chest pain, pressure. No palpitations or edema RESPIRATORY; No shortness of breath, cough or sputum GASTROINTESTINAL; No nausea, vomiting, diarrhea or dysphagia GENITOURINARY; No dysuria, new  incontinence, suprapubic pain NEUROLOGICAL; No headache, dizziness, syncopy, tingling MUSCULOSKELETAL; No muscle or joint pain or injuries HEMATOLOGICAL; No anemia, bleeding or abnormal bruising noted PSYCHIATRIC; No manic symptoms or severely blue mood ENDOCRINE; No sweating, temperature intolerance or polyuria, polydipsia   Patient Active Problem List  Diagnosis   . History of breast cancer  . Healthcare maintenance  . Family history of coronary arteriosclerosis  . Carotid artery disease (CMS-HCC)  . OSA (obstructive sleep apnea)  . Anxiety, mild  . Controlled type 2 diabetes mellitus with complication, without long-term current use of insulin (CMS/HHS-HCC)  . Migraine    Past Medical History:  Diagnosis Date  . Allergy    Seasonal  . Anxiety, mild 10/26/2018  . Breast cancer (CMS/HHS-HCC)   . Endometriosis of uterus   . Fibroids   . History of cataract 07/2022  . Hyperlipidemia 08/2017  . Migraines   . OSA (obstructive sleep apnea) 07/05/2018    Past Surgical History:  Procedure Laterality Date  . TUBAL LIGATION  01/1990  . CARPAL TUNNEL RELEASE  1998  . COLONOSCOPY N/A 12/31/2004   Dr. CHARM Arts @ [facility] - Int. Hemorrh., Diverticulosis, FHPolyps(f)  . LAPAROSCOPIC CHOLECYSTECTOMY  2007  . COLONOSCOPY N/A 04/19/2016   Dr. FABIENE Holmes @ [facility] - Sessile Serrated Adenoma, FHPolyps(f): [facility] 03/2017; Recall Ltr 02/17/2017 (dw)  . MASTECTOMY Left 04/2016   Left partial mastectomy with sentinel lymph node biopsy  . Colon @ [facility]  05/09/2022   Sessile serrated adenoma/PHx CP/Repeat 11yrs/TKT  . CATARACT EXTRACTION  07/2022  . CESAREAN SECTION     twin pregnancy  . CHOLECYSTECTOMY    . ENDOSCOPIC CARPAL TUNNEL RELEASE    . HYSTEROSCOPY W/ ENDOMETRIAL ABLATION     x 3    Family History  Problem Relation Name Age of Onset  . Diabetes Mother Jacquelyn   . Heart disease Mother Jacquelyn   . High blood pressure (Hypertension) Mother Ritta        No longer  . Cancer Mother Jacquelyn        Bladder  . Diabetes type II Mother Jacquelyn   . Hip fracture Mother Ritta   . Diabetes Father Lamar        Deceased  . High blood pressure (Hypertension) Father Lamar        Deceased  . Colon polyps Father Lamar   . Diabetes type II Father Lamar   . Alcohol abuse Sister Rollene   . Alcohol abuse Brother Arley   . Alcohol  abuse Son Reyes     No Known Allergies   Social History   Socioeconomic History  . Marital status: Married  Tobacco Use  . Smoking status: Never  . Smokeless tobacco: Never  Vaping Use  . Vaping status: Never Used  Substance and Sexual Activity  . Alcohol use: Yes    Alcohol/week: 4.0 standard drinks of alcohol    Types: 2 Glasses of wine, 2 Cans of beer per week    Comment: 2-3 X's week  . Drug use: No  . Sexual activity: Yes    Partners: Male    Birth control/protection: Post-menopausal   Social Drivers of Corporate investment banker Strain: Low Risk  (08/10/2023)   Overall Financial Resource Strain (CARDIA)   . Difficulty of Paying Living Expenses: Not hard at all  Food Insecurity: No Food Insecurity (08/10/2023)   Hunger Vital Sign   . Worried About Programme researcher, broadcasting/film/video in the Last Year: Never true   .  Ran Out of Food in the Last Year: Never true  Transportation Needs: No Transportation Needs (08/10/2023)   PRAPARE - Transportation   . Lack of Transportation (Medical): No   . Lack of Transportation (Non-Medical): No  Physical Activity: Inactive (10/15/2017)   Received from [facility] [place]   Exercise Vital Sign   . Days of Exercise per Week: 0 days   . Minutes of Exercise per Session: 0 min  Stress: No Stress Concern Present (10/15/2017)   Received from [facility]   [facility] - Occupational Stress Questionnaire   . Feeling of Stress : Not at all  Social Connections: Unknown (10/15/2017)   Received from [facility]   Social Connection and Isolation Panel [NHANES]   . Frequency of Communication with Friends and Family: Never   . Frequency of Social Gatherings with Friends and Family: Never   . Attends Religious Services: Never   . Active Member of Clubs or Organizations: Patient declined   . Attends Banker Meetings: Patient declined   . Marital Status: Patient declined  Housing  Stability: Low Risk  (08/15/2023)   Housing Stability Vital Sign   . Unable to Pay for Housing in the Last Year: No   . Number of Times Moved in the Last Year: 0   . Homeless in the Last Year: No      Current Outpatient Medications:  .  BACILLUS COAGULANS (PROBIOTIC, B. COAGULANS, ORAL), Take 1 capsule by mouth every Monday, Wednesday, and Friday, Disp: , Rfl:  .  clonazePAM (KLONOPIN) 0.5 MG tablet, Take 0.5 tablets (0.25 mg total) by mouth 2 (two) times daily as needed, Disp: 30 tablet, Rfl: 0 .  propranoloL  (INDERAL ) 20 MG tablet, Take 1 tablet (20 mg total) by mouth 2 (two) times daily, Disp: 180 tablet, Rfl: 3 .  rosuvastatin  (CRESTOR ) 20 MG tablet, Take 1 tablet (20 mg total) by mouth once daily, Disp: 90 tablet, Rfl: 3 .  semaglutide (OZEMPIC) 2 mg/dose (8 mg/3 mL) pen injector, Inject 0.75 mLs (2 mg total) subcutaneously once a week, Disp: 9 mL, Rfl: 3 .  acetaminophen  (TYLENOL ) 500 MG tablet, Take 1,000 mg by mouth every 8 (eight) hours as needed   , Disp: , Rfl:  .  cyanocobalamin (VITAMIN B12) 1000 MCG tablet, Take 1,000 mcg by mouth once daily (Patient not taking: Reported on 08/15/2023), Disp: , Rfl:   Vitals:   08/15/23 0820  BP: 131/76  Pulse: 68    Body mass index is 32.49 kg/m. No acute distress, pleasant  HEENT: Normocephalic and Atraumatic, Oropharynx is clear, Tympanic membranes clear, conjunctiva have normal color NECK: No bruits, thyromegalia or adenopathy is noted CHEST; No distress, normal to inspection, clear to auscultation CARDIOVASCULAR; Regular rate and rhythm, no murmurs rubs or gallops appreciated. Peripheral pulses were palpated and present.  ABDOMEN; Soft and flat and nontender with bowel sounds appreciated in the normal range EXTREMITIES; No clubbing cyanosis or edema NEUROLOGICAL; Alert and responsive with good insight. Motor function and sensation are intact. Reflexes are present SKIN; No suspicious lesions are noted.    Ancillary Orders on  08/08/2023  Component Date Value Ref Range Status  . Glucose 08/08/2023 104  70 - 110 mg/dL Final  . Sodium 96/81/7974 143  136 - 145 mmol/L Final  . Potassium 08/08/2023 5.0  3.6 - 5.1 mmol/L Final  . Chloride 08/08/2023 106  97 - 109 mmol/L Final  . Carbon Dioxide (CO2) 08/08/2023  29.6  22.0 - 32.0 mmol/L Final  . Urea Nitrogen (BUN) 08/08/2023 13  7 - 25 mg/dL Final  . Creatinine 96/81/7974 0.8  0.6 - 1.1 mg/dL Final  . Glomerular Filtration Rate (eGFR) 08/08/2023 80  >60 mL/min/1.73sq m Final  . Calcium  08/08/2023 9.8  8.7 - 10.3 mg/dL Final  . AST  96/81/7974 28  8 - 39 U/L Final  . ALT  08/08/2023 42 (H)  5 - 38 U/L Final  . Alk Phos (alkaline Phosphatase) 08/08/2023 63  34 - 104 U/L Final  . Albumin 08/08/2023 4.7  3.5 - 4.8 g/dL Final  . Bilirubin, Total 08/08/2023 0.4  0.3 - 1.2 mg/dL Final  . Protein, Total 08/08/2023 6.8  6.1 - 7.9 g/dL Final  . A/G Ratio 96/81/7974 2.2  1.0 - 5.0 gm/dL Final  . Hemoglobin J8R 08/08/2023 6.3 (H)  4.2 - 5.6 % Final  . Average Blood Glucose (Calc) 08/08/2023 134  mg/dL Final  . Cholesterol, Total 08/08/2023 123  100 - 200 mg/dL Final  . Triglyceride 96/81/7974 173  35 - 199 mg/dL Final  . HDL (High Density Lipoprotein) Cho* 08/08/2023 42.7  35.0 - 85.0 mg/dL Final  . LDL Calculated 08/08/2023 46  0 - 130 mg/dL Final  . VLDL Cholesterol 08/08/2023 35  mg/dL Final  . Cholesterol/HDL Ratio 08/08/2023 2.9   Final  . Creatinine, Random Urine 08/08/2023 44.0  37.0 - 250.0 mg/dL Final  . Urine Albumin, Random 08/08/2023 <7    mg/L Final  . Urine Albumin/Creatinine Ratio 08/08/2023 <15.9  <30.0 ug/mg Final  Appointment on 01/24/2023  Component Date Value Ref Range Status  . Glucose 01/24/2023 162 (H)  70 - 110 mg/dL Final  . Sodium 90/96/7975 140  136 - 145 mmol/L Final  . Potassium 01/24/2023 4.2  3.6 - 5.1 mmol/L Final  . Chloride 01/24/2023 106  97 - 109 mmol/L Final  . Carbon Dioxide (CO2) 01/24/2023 23.9  22.0 - 32.0 mmol/L Final  . Urea  Nitrogen (BUN) 01/24/2023 16  7 - 25 mg/dL Final  . Creatinine 90/96/7975 0.8  0.6 - 1.1 mg/dL Final  . Glomerular Filtration Rate (eGFR) 01/24/2023 80  >60 mL/min/1.73sq m Final  . Calcium  01/24/2023 9.3  8.7 - 10.3 mg/dL Final  . AST  90/96/7975 34  8 - 39 U/L Final  . ALT  01/24/2023 44 (H)  5 - 38 U/L Final  . Alk Phos (alkaline Phosphatase) 01/24/2023 60  34 - 104 U/L Final  . Albumin 01/24/2023 4.6  3.5 - 4.8 g/dL Final  . Bilirubin, Total 01/24/2023 0.5  0.3 - 1.2 mg/dL Final  . Protein, Total 01/24/2023 6.8  6.1 - 7.9 g/dL Final  . A/G Ratio 90/96/7975 2.1  1.0 - 5.0 gm/dL Final  . Hemoglobin J8R 01/24/2023 7.7 (H)  4.2 - 5.6 % Final  . Average Blood Glucose (Calc) 01/24/2023 174  mg/dL Final  . Cholesterol, Total 01/24/2023 114  100 - 200 mg/dL Final  . Triglyceride 90/96/7975 160  35 - 199 mg/dL Final  . HDL (High Density Lipoprotein) Cho* 01/24/2023 38.2  35.0 - 85.0 mg/dL Final  . LDL Calculated 01/24/2023 44  0 - 130 mg/dL Final  . VLDL Cholesterol 01/24/2023 32  mg/dL Final  . Cholesterol/HDL Ratio 01/24/2023 3.0   Final  . Creatinine, Random Urine 01/24/2023 187.3  37.0 - 250.0 mg/dL Final  . Urine Albumin, Random 01/24/2023 29    mg/L Final  . Urine Albumin/Creatinine Ratio 01/24/2023 15.5  <30.0 ug/mg  Final    ASSESSMENT  AND PLAN:  Diagnoses and all orders for this visit:  Routine general medical examination at a health care facility -     Comprehensive Metabolic Panel (CMP); Future -     Hemoglobin A1C; Future -     Lipid Panel w/calc LDL; Future -     Microalbumin/Creatinine Ratio, Random Urine; Future -     Viral Hepatitis HBV, HCV - [facility] -     Ferritin  Healthcare maintenance  OSA (obstructive sleep apnea) Assessment & Plan: Continues to use cpap consistently and is continuing to benefit from it's use.     Controlled type 2 diabetes mellitus with complication, without long-term current use of insulin (CMS/HHS-HCC) Assessment & Plan: Following a  diabetic diet and medications are tolerated as appropriate.    Orders: -     Comprehensive Metabolic Panel (CMP); Future -     Hemoglobin A1C; Future -     Lipid Panel w/calc LDL; Future -     Microalbumin/Creatinine Ratio, Random Urine; Future -     Viral Hepatitis HBV, HCV - [facility] -     Ferritin  Carotid artery disease, unspecified laterality, unspecified type (CMS-HCC) Assessment & Plan: No new focal neuro symptoms on rosuvastatin    Orders: -     Comprehensive Metabolic Panel (CMP); Future -     Hemoglobin A1C; Future -     Lipid Panel w/calc LDL; Future -     Microalbumin/Creatinine Ratio, Random Urine; Future -     Viral Hepatitis HBV, HCV - [facility] -     Ferritin  Anxiety, mild Assessment & Plan: Rare prn benzo's have been used   Encounter for screening mammogram for breast cancer -     Mammo screening digital bilateral; Future      Goals     . * Lose Weight (pt-stated)      Patient would like to lose 27 pounds.  7979-805 lbs 2021-179 lbs 2022-174 lbs 2024-200 lbs 2025-183 lbs           *Some images could not be shown.

## 2023-08-21 ENCOUNTER — Ambulatory Visit
Admission: RE | Admit: 2023-08-21 | Discharge: 2023-08-21 | Disposition: A | Discharge status: Home / Self Care | Source: Ambulatory Visit | Attending: Internal Medicine | Admitting: Internal Medicine

## 2023-08-21 DIAGNOSIS — Z1231 Encounter for screening mammogram for malignant neoplasm of breast: Secondary | ICD-10-CM | POA: Insufficient documentation

## 2024-01-24 ENCOUNTER — Other Ambulatory Visit: Payer: Self-pay

## 2024-01-24 ENCOUNTER — Emergency Department

## 2024-01-24 ENCOUNTER — Observation Stay
Admission: EM | Admit: 2024-01-24 | Discharge: 2024-01-26 | Disposition: A | Discharge status: Home / Self Care | Source: Ambulatory Visit | Attending: Cardiovascular Disease | Admitting: Cardiovascular Disease

## 2024-01-24 ENCOUNTER — Inpatient Hospital Stay

## 2024-01-24 DIAGNOSIS — I161 Hypertensive emergency: Secondary | ICD-10-CM | POA: Insufficient documentation

## 2024-01-24 DIAGNOSIS — I214 Non-ST elevation (NSTEMI) myocardial infarction: Principal | ICD-10-CM | POA: Insufficient documentation

## 2024-01-24 DIAGNOSIS — Z853 Personal history of malignant neoplasm of breast: Secondary | ICD-10-CM | POA: Diagnosis not present

## 2024-01-24 DIAGNOSIS — I10 Essential (primary) hypertension: Secondary | ICD-10-CM | POA: Insufficient documentation

## 2024-01-24 DIAGNOSIS — E1165 Type 2 diabetes mellitus with hyperglycemia: Secondary | ICD-10-CM | POA: Diagnosis not present

## 2024-01-24 DIAGNOSIS — E782 Mixed hyperlipidemia: Secondary | ICD-10-CM | POA: Insufficient documentation

## 2024-01-24 DIAGNOSIS — E118 Type 2 diabetes mellitus with unspecified complications: Secondary | ICD-10-CM

## 2024-01-24 DIAGNOSIS — E785 Hyperlipidemia, unspecified: Secondary | ICD-10-CM

## 2024-01-24 DIAGNOSIS — R079 Chest pain, unspecified: Secondary | ICD-10-CM | POA: Diagnosis present

## 2024-01-24 DIAGNOSIS — Z7982 Long term (current) use of aspirin: Secondary | ICD-10-CM | POA: Diagnosis not present

## 2024-01-24 DIAGNOSIS — Z79899 Other long term (current) drug therapy: Secondary | ICD-10-CM | POA: Diagnosis not present

## 2024-01-24 DIAGNOSIS — I25110 Atherosclerotic heart disease of native coronary artery with unstable angina pectoris: Secondary | ICD-10-CM

## 2024-01-24 DIAGNOSIS — G43909 Migraine, unspecified, not intractable, without status migrainosus: Secondary | ICD-10-CM | POA: Insufficient documentation

## 2024-01-24 DIAGNOSIS — Z794 Long term (current) use of insulin: Secondary | ICD-10-CM | POA: Insufficient documentation

## 2024-01-24 DIAGNOSIS — I5181 Takotsubo syndrome: Secondary | ICD-10-CM

## 2024-01-24 LAB — PROTIME-INR
INR: 1 (ref 0.8–1.2)
Prothrombin Time: 13.7 s (ref 11.4–15.2)

## 2024-01-24 LAB — CBC
HCT: 44.4 % (ref 36.0–46.0)
Hemoglobin: 15.1 g/dL — ABNORMAL HIGH (ref 12.0–15.0)
MCH: 30.3 pg (ref 26.0–34.0)
MCHC: 34 g/dL (ref 30.0–36.0)
MCV: 89 fL (ref 80.0–100.0)
Platelets: 246 K/uL (ref 150–400)
RBC: 4.99 MIL/uL (ref 3.87–5.11)
RDW: 12.2 % (ref 11.5–15.5)
WBC: 7 K/uL (ref 4.0–10.5)
nRBC: 0 % (ref 0.0–0.2)

## 2024-01-24 LAB — BASIC METABOLIC PANEL WITH GFR
Anion gap: 10 (ref 5–15)
BUN: 11 mg/dL (ref 8–23)
CO2: 24 mmol/L (ref 22–32)
Calcium: 9.3 mg/dL (ref 8.9–10.3)
Chloride: 106 mmol/L (ref 98–111)
Creatinine, Ser: 0.8 mg/dL (ref 0.44–1.00)
GFR, Estimated: >60 mL/min (ref >60)
Glucose, Bld: 98 mg/dL (ref 70–99)
Potassium: 4.3 mmol/L (ref 3.5–5.1)
Sodium: 140 mmol/L (ref 135–145)

## 2024-01-24 LAB — APTT: aPTT: 25 s (ref 24–36)

## 2024-01-24 LAB — TROPONIN I (HIGH SENSITIVITY)
Troponin I (High Sensitivity): 2374 ng/L (ref <18)
Troponin I (High Sensitivity): 2321 ng/L (ref <18)

## 2024-01-24 MED ORDER — NITROGLYCERIN 0.4 MG SL SUBL: 0.4000 mg | SUBLINGUAL_TABLET | SUBLINGUAL | Status: DC | PRN

## 2024-01-24 MED ORDER — ONDANSETRON HCL 4 MG/2ML IJ SOLN: 4.0000 mg | Freq: Four times a day (QID) | INTRAMUSCULAR | Status: DC | PRN

## 2024-01-24 MED ORDER — PROPRANOLOL HCL 20 MG PO TABS
20.0000 mg | ORAL_TABLET | Freq: Every day | ORAL | Status: DC
  Administered 2024-01-26: 20 mg via ORAL
  Filled 2024-01-24: qty 1

## 2024-01-24 MED ORDER — ASPIRIN 81 MG PO TBEC
81.0000 mg | DELAYED_RELEASE_TABLET | Freq: Every day | ORAL | Status: DC
  Administered 2024-01-26: 81 mg via ORAL
  Filled 2024-01-24 (×2): qty 1

## 2024-01-24 MED ORDER — LISINOPRIL 10 MG PO TABS
10.0000 mg | ORAL_TABLET | Freq: Every day | ORAL | Status: DC
  Administered 2024-01-24: 10 mg via ORAL
  Filled 2024-01-24: qty 1

## 2024-01-24 MED ORDER — ACETAMINOPHEN 325 MG PO TABS
650.0000 mg | ORAL_TABLET | Freq: Four times a day (QID) | ORAL | Status: DC | PRN
  Administered 2024-01-24 – 2024-01-26 (×3): 650 mg via ORAL
  Filled 2024-01-24 (×2): qty 2

## 2024-01-24 MED ORDER — HEPARIN (PORCINE) 25000 UT/250ML-% IV SOLN
1000.0000 [IU]/h | INTRAVENOUS | Status: DC
  Administered 2024-01-24: 800 [IU]/h via INTRAVENOUS
  Filled 2024-01-24: qty 250

## 2024-01-24 MED ORDER — HEPARIN BOLUS VIA INFUSION
4000.0000 [IU] | Freq: Once | INTRAVENOUS | Status: AC
  Administered 2024-01-24: 4000 [IU] via INTRAVENOUS
  Filled 2024-01-24: qty 4000

## 2024-01-24 MED ORDER — ASPIRIN 81 MG PO CHEW
324.0000 mg | CHEWABLE_TABLET | Freq: Once | ORAL | Status: AC
  Administered 2024-01-24: 324 mg via ORAL
  Filled 2024-01-24: qty 4

## 2024-01-24 MED ORDER — LORATADINE 10 MG PO TABS: 10.0000 mg | ORAL_TABLET | Freq: Every day | ORAL | Status: DC

## 2024-01-24 MED ORDER — ALBUTEROL SULFATE (2.5 MG/3ML) 0.083% IN NEBU: 2.5000 mg | INHALATION_SOLUTION | RESPIRATORY_TRACT | Status: DC | PRN

## 2024-01-24 MED ORDER — HYDRALAZINE HCL 20 MG/ML IJ SOLN: 5.0000 mg | Freq: Four times a day (QID) | INTRAMUSCULAR | Status: DC | PRN

## 2024-01-24 MED ORDER — ASPIRIN 81 MG PO TBEC: 81.0000 mg | DELAYED_RELEASE_TABLET | Freq: Every day | ORAL | Status: DC

## 2024-01-24 MED ORDER — ROSUVASTATIN CALCIUM 10 MG PO TABS
20.0000 mg | ORAL_TABLET | Freq: Every day | ORAL | Status: DC
  Administered 2024-01-25 – 2024-01-26 (×2): 20 mg via ORAL
  Filled 2024-01-24 (×2): qty 1
  Filled 2024-01-24: qty 2

## 2024-01-24 MED ORDER — IOHEXOL 350 MG/ML SOLN
75.0000 mL | Freq: Once | INTRAVENOUS | Status: AC | PRN
  Administered 2024-01-24: 75 mL via INTRAVENOUS

## 2024-01-24 MED ORDER — NITROGLYCERIN 2 % TD OINT
1.0000 [in_us] | TOPICAL_OINTMENT | Freq: Four times a day (QID) | TRANSDERMAL | Status: DC
  Administered 2024-01-24: 1 [in_us] via TOPICAL
  Filled 2024-01-24 (×2): qty 1

## 2024-01-24 MED ORDER — LETROZOLE 2.5 MG PO TABS: 2.5000 mg | ORAL_TABLET | Freq: Every day | ORAL | Status: DC

## 2024-01-24 NOTE — ED Triage Notes (Signed)
 Patient sent over from [facility] for chest pain that started at 0830 this morning while reading a book.

## 2024-01-24 NOTE — ED Notes (Signed)
 MD Ernest made aware of critical result called to this RN From lab. Troponin 2,374

## 2024-01-24 NOTE — H&P (Addendum)
 History and Physical    Jill Reilly FMW:[id number] DOB: [birth year] DOA: 01/24/2024  PCP: Lenon Layman ORN, MD (Confirm with patient/family/NH records and if not entered, this has to be entered at [facility] point of entry) Patient coming from: Home  I have personally briefly reviewed patient's old medical records in [facility]  Chief Complaint: Chest pain  HPI: Jill Reilly is a 68 y.o. female with medical history significant of IIDM on Ozempic, HLD, breast cancer status post lumpectomy and radiation, GERD, presented with new onset chest pain.  Patient started to have a pressure-like chest pain yesterday evening, pressure-like, 5-6/10, lasted for few minutes localized, denied any associated symptoms of shortness of breath palpitations sweating and chest pain subsided by its own.  This morning, patient woke up again with severe pressure-like chest pain this time, the chest pain radiated to both shoulders,upper back and left upper arm and associated with sweating.  EMS arrived found patient blood pressure in 200s.  ED Course: Blood pressure remains high at 169/100, chest x-ray negative for acute findings EKG negative for acute ST changes, troponin/2300, BUN 11 creatinine 0 point glucose 98, WBC 7.0 hemoglobin 15.1.  Patient was given nitroglycerin  and heparin  drip started.  Review of Systems: As per HPI otherwise 14 point review of systems negative.    Past Medical History:  Diagnosis Date   Anxiety    Arthritis    FINGERS   Cancer (HCC) 03/2016   left breast   GERD (gastroesophageal reflux disease)    Headache    H/O MIGRAINES   Personal history of radiation therapy 2017/2018   left breast ca    Past Surgical History:  Procedure Laterality Date   BILATERAL CARPAL TUNNEL RELEASE     BREAST BIOPSY Left 03/31/2016   invasive mammary   BREAST LUMPECTOMY Left 04/29/2016   invasive mammary carcinoma. Clear margins   CATARACT EXTRACTION W/PHACO Left 08/10/2022   Procedure:  CATARACT EXTRACTION PHACO AND INTRAOCULAR LENS PLACEMENT (IOC) LEFT  CLAREON TORIC LENS  6.33  00:54.6;  Surgeon: Mittie Gaskin, MD;  Location: [facility];  Service: Ophthalmology;  Laterality: Left;   CATARACT EXTRACTION W/PHACO Right 08/31/2022   Procedure: CATARACT EXTRACTION PHACO AND INTRAOCULAR LENS PLACEMENT (IOC) RIGHT CLAREON TORIC 7.78 00:51.5;  Surgeon: Mittie Gaskin, MD;  Location: [facility];  Service: Ophthalmology;  Laterality: Right;   CESAREAN SECTION     CHOLECYSTECTOMY     PARTIAL MASTECTOMY WITH NEEDLE LOCALIZATION Left 04/29/2016   Procedure: PARTIAL MASTECTOMY WITH NEEDLE LOCALIZATION;  Surgeon: Larinda Unknown Sharps, MD;  Location: [facility];  Service: General;  Laterality: Left;   SENTINEL NODE BIOPSY Left 04/29/2016   Procedure: SENTINEL NODE BIOPSY;  Surgeon: Larinda Unknown Sharps, MD;  Location: [facility];  Service: General;  Laterality: Left;     reports that she has never smoked. She has never used smokeless tobacco. She reports current alcohol use. She reports that she does not use drugs.  No Known Allergies  Family History  Problem Relation Age of Onset   Breast cancer Neg Hx     Prior to Admission medications   Medication Sig Start Date End Date Taking? Authorizing Provider  acetaminophen  (TYLENOL ) 500 MG tablet Take 1,000 mg by mouth every 6 (six) hours as needed for moderate pain or headache.    [provider]  acidophilus (RISAQUAD) CAPS capsule Take by mouth daily. Patient not taking: Reported on 08/18/2022    [provider]  albuterol  (VENTOLIN  HFA) 108 (90  Base) MCG/ACT inhaler Inhale 2 puffs into the lungs every 4 (four) hours as needed. 02/08/23   Brimage, Vondra, DO  aspirin  EC 81 MG tablet Take 81 mg by mouth daily.    [provider]  azelastine (ASTELIN) 0.1 % nasal spray Place into the nose. 06/26/18 06/26/19  [provider]  benzonatate  (TESSALON ) 100 MG capsule Take 1 capsule (100 mg  total) by mouth every 8 (eight) hours. 02/08/23   Brimage, Vondra, DO  Ca Carbonate-Mag Hydroxide (ROLAIDS PO) Take 1 tablet by mouth as needed ((stomach acid)).     [provider]  cetirizine (ZYRTEC) 10 MG tablet Take 1 tablet by mouth daily as needed for allergies.    [provider]  clonazePAM (KLONOPIN) 0.5 MG tablet Take by mouth. 10/26/18 11/25/18  [provider]  Cyanocobalamin (VITAMIN B 12) 500 MCG TABS Take 1,000 1e11 Vector Genomes by mouth 1 day or 1 dose.    [provider]  fluticasone (FLONASE) 50 MCG/ACT nasal spray Place 1 spray into both nostrils daily.    [provider]  ibuprofen  (ADVIL ,MOTRIN ) 200 MG tablet Take 400 mg by mouth every 6 (six) hours as needed for headache or moderate pain.    [provider]  letrozole  (FEMARA ) 2.5 MG tablet TAKE ONE TABLET BY MOUTH EVERY DAY 09/06/18   Finnegan, Timothy J, MD  predniSONE  (STERAPRED UNI-PAK 21 TAB) 10 MG (21) TBPK tablet Take by mouth daily. Take 6 tabs by mouth daily for 1, then 5 tabs for 1 day, then 4 tabs for 1 day, then 3 tabs for 1 day, then 2 tabs for 1 day, then 1 tab for 1 day. 02/08/23   Brimage, Vondra, DO  promethazine -dextromethorphan (PROMETHAZINE -DM) 6.25-15 MG/5ML syrup Take 5 mLs by mouth 4 (four) times daily as needed. 02/08/23   Brimage, Vondra, DO  propranolol  (INDERAL ) 40 MG tablet Take 20 mg by mouth 1 day or 1 dose.    [provider]  rosuvastatin  (CRESTOR ) 20 MG tablet Take 20 mg by mouth daily. 09/18/17 09/18/18  [provider]    Physical Exam: Vitals:   01/24/24 1104 01/24/24 1105 01/24/24 1400  BP:  (!) 166/95 (!) 163/101  Pulse:  62 65  Resp:  16 (!) 22  Temp:  97.8 F (36.6 C)   TempSrc:  Oral   SpO2:  100% 100%  Weight: 79.4 kg    Height: 5' 3 (1.6 m)      Constitutional: NAD, calm, comfortable Vitals:   01/24/24 1104 01/24/24 1105 01/24/24 1400  BP:  (!) 166/95 (!) 163/101  Pulse:  62 65  Resp:  16 (!) 22  Temp:   97.8 F (36.6 C)   TempSrc:  Oral   SpO2:  100% 100%  Weight: 79.4 kg    Height: 5' 3 (1.6 m)     Eyes: PERRL, lids and conjunctivae normal ENMT: Mucous membranes are moist. Posterior pharynx clear of any exudate or lesions.Normal dentition.  Neck: normal, supple, no masses, no thyromegaly Respiratory: clear to auscultation bilaterally, no wheezing, no crackles. Normal respiratory effort. No accessory muscle use.  Cardiovascular: Regular rate and rhythm, no murmurs / rubs / gallops. No extremity edema. 2+ pedal pulses. No carotid bruits.  Abdomen: no tenderness, no masses palpated. No hepatosplenomegaly. Bowel sounds positive.  Musculoskeletal: no clubbing / cyanosis. No joint deformity upper and lower extremities. Good ROM, no contractures. Normal muscle tone.  Skin: no rashes, lesions, ulcers. No induration Neurologic: CN 2-12 grossly intact. Sensation  intact, DTR normal. Strength 5/5 in all 4.  Psychiatric: Normal judgment and insight. Alert and oriented x 3. Normal mood.     Labs on Admission: I have personally reviewed following labs and imaging studies  CBC: Recent Labs  Lab 01/24/24 1106  WBC 7.0  HGB 15.1*  HCT 44.4  MCV 89.0  PLT 246   Basic Metabolic Panel: Recent Labs  Lab 01/24/24 1106  NA 140  K 4.3  CL 106  CO2 24  GLUCOSE 98  BUN 11  CREATININE 0.80  CALCIUM  9.3   GFR: Estimated Creatinine Clearance: 65.3 mL/min (by C-G formula based on SCr of 0.8 mg/dL). Liver Function Tests: No results for input(s): AST, ALT, ALKPHOS, BILITOT, PROT, ALBUMIN in the last 168 hours. No results for input(s): LIPASE, AMYLASE in the last 168 hours. No results for input(s): AMMONIA in the last 168 hours. Coagulation Profile: Recent Labs  Lab 01/24/24 1401  INR 1.0   Cardiac Enzymes: No results for input(s): CKTOTAL, CKMB, CKMBINDEX, TROPONINI in the last 168 hours. BNP (last 3 results) No results for input(s): PROBNP in the last 8760  hours. HbA1C: No results for input(s): HGBA1C in the last 72 hours. CBG: No results for input(s): GLUCAP in the last 168 hours. Lipid Profile: No results for input(s): CHOL, HDL, LDLCALC, TRIG, CHOLHDL, LDLDIRECT in the last 72 hours. Thyroid Function Tests: No results for input(s): TSH, T4TOTAL, FREET4, T3FREE, THYROIDAB in the last 72 hours. Anemia Panel: No results for input(s): VITAMINB12, FOLATE, FERRITIN, TIBC, IRON, RETICCTPCT in the last 72 hours. Urine analysis:    Component Value Date/Time   COLORURINE YELLOW (A) 10/15/2017 1640   APPEARANCEUR CLEAR (A) 10/15/2017 1640   LABSPEC 1.016 10/15/2017 1640   PHURINE 5.0 10/15/2017 1640   GLUCOSEU NEGATIVE 10/15/2017 1640   HGBUR NEGATIVE 10/15/2017 1640   BILIRUBINUR NEGATIVE 10/15/2017 1640   KETONESUR NEGATIVE 10/15/2017 1640   PROTEINUR NEGATIVE 10/15/2017 1640   NITRITE NEGATIVE 10/15/2017 1640   LEUKOCYTESUR NEGATIVE 10/15/2017 1640    Radiological Exams on Admission: DG Chest 2 View Result Date: 01/24/2024 CLINICAL DATA:  Chest pain. EXAM: CHEST - 2 VIEW COMPARISON:  February 08, 2023. FINDINGS: The heart size and mediastinal contours are within normal limits. Both lungs are clear. The visualized skeletal structures are unremarkable. IMPRESSION: No active cardiopulmonary disease. Electronically Signed   By: Lynwood Landy Raddle M.D.   On: 01/24/2024 11:43    EKG: Independently reviewed.  Sinus rhythm, no acute ST changes.  Assessment/Plan Principal Problem:   NSTEMI (non-ST elevated myocardial infarction) (HCC)  (please populate well all problems here in Problem List. (For example, if patient is on BP meds at home and you resume or decide to hold them, it is a problem that needs to be her. Same for CAD, COPD, HLD and so on)  Chest pain NSTEMI - Continue ACS medication including heparin  drip - Continue aspirin  - Heart rate borderline bradycardia, will hold off beta-blocker - Risk  factors modification, check A1c and lipid panel - Echocardiogram - Other DDx, given that the patient described chest pain this morning also radiating to her back, and her blood pressure was extremely high when found by EMS, will order CTA to rule out dissection  HTN emergency - As needed hydralazine  - Start ACEI  IIDM - Controlled - Outpatient Ozempic therapy  DVT prophylaxis: Heparin  drip Code Status: Full code Family Communication: Husband at bedside Disposition Plan: Patient is sick with NSTEMI, requiring inpatient ischemic workup, expect more than 2  midnight hospital stay Consults called: Cardiology Admission status: PCU admit   Cort Jill Reilly Mana MD [facility] Pager [phone]  01/24/2024, 2:56 PM

## 2024-01-24 NOTE — ED Notes (Signed)
 Patient transported to CT

## 2024-01-24 NOTE — Consult Note (Signed)
 PHARMACY - ANTICOAGULATION CONSULT NOTE  Pharmacy Consult for heparin  infusion Indication: chest pain/ACS  No Known Allergies  Patient Measurements: Height: 5' 3 (160 cm) Weight: 79.4 kg (175 lb) IBW/kg (Calculated) : 52.4 HEPARIN  DW (KG): 69.7  Vital Signs: Temp: 98.2 F (36.8 C) (09/03 1512) Temp Source: Oral (09/03 1512) BP: 159/99 (09/03 1537) Pulse Rate: 65 (09/03 1430)  Labs: Recent Labs    01/24/24 1106 01/24/24 1401  HGB 15.1*  --   HCT 44.4  --   PLT 246  --   APTT  --  25  LABPROT  --  13.7  INR  --  1.0  CREATININE 0.80  --   TROPONINIHS 2,374* 2,321*   Estimated Creatinine Clearance: 65.3 mL/min (by C-G formula based on SCr of 0.8 mg/dL).  Medical History: Past Medical History:  Diagnosis Date   Anxiety    Arthritis    FINGERS   Cancer (HCC) 03/2016   left breast   GERD (gastroesophageal reflux disease)    Headache    H/O MIGRAINES   Personal history of radiation therapy 2017/2018   left breast ca   Medications:  No AC prior to admission per med list and fill hx.  Assessment: Patent with PMH including hx of h/o breast cancer, carotid artery disease, OSA, anxiety, DM2. Present to ED with chest pain. Pharmacy consulted to initiative and manage heparin  infusion for NSTEMI. Tropinin 2374  Baseline labs: hgb 15.1, plt 246, INR 1, apTT 25  Goal of Therapy:  Heparin  level 0.3-0.7 units/ml Monitor platelets by anticoagulation protocol: Yes   Plan:  Give 4000 units bolus x 1 Start heparin  infusion at 800 units/hr Check heparin  level in 8 hours and daily while on heparin  Continue to monitor H&H and platelets  Elaisha Zahniser Rodriguez-Guzman PharmD, BCPS 01/24/2024 4:11 PM

## 2024-01-24 NOTE — ED Provider Notes (Signed)
 [facility] Provider Note    Event Date/Time   First MD Initiated Contact with Patient 01/24/24 1344     (approximate)   History   Chest Pain   HPI  Jill Reilly is a 68 y.o. female with a Struve high cholesterol who presents with chest pain.  The patient states that she had some mild pain yesterday that resolved.  Subsequently she developed more severe substernal chest pain radiating to her back around 830 this morning.  It has now improved.  She still has some discomfort in the back.  She denies associated shortness of breath or lightheadedness.  She has never had pain like this before.  I reviewed the past medical records.  The patient's most recent outpatient encounter in our system was on 3/25 with internal medicine for a Medicare wellness visit.  She has no recent hospitalizations.   Physical Exam   Triage Vital Signs: ED Triage Vitals  Encounter Vitals Group     BP 01/24/24 1105 (!) 166/95     Girls Systolic BP Percentile --      Girls Diastolic BP Percentile --      Boys Systolic BP Percentile --      Boys Diastolic BP Percentile --      Pulse Rate 01/24/24 1105 62     Resp 01/24/24 1105 16     Temp 01/24/24 1105 97.8 F (36.6 C)     Temp Source 01/24/24 1105 Oral     SpO2 01/24/24 1105 100 %     Weight 01/24/24 1104 175 lb (79.4 kg)     Height 01/24/24 1104 5' 3 (1.6 m)     Head Circumference --      Peak Flow --      Pain Score 01/24/24 1103 5     Pain Loc --      Pain Education --      Exclude from Growth Chart --     Most recent vital signs: Vitals:   01/24/24 1430 01/24/24 1512  BP: (!) 161/129   Pulse: 65   Resp: 19   Temp:  98.2 F (36.8 C)  SpO2: 99%      General: Alert, well-appearing, no distress.  CV:  Good peripheral perfusion.  Normal heart sounds. Resp:  Normal effort.  Lungs CTAB. Abd:  No distention.  Other:  No peripheral edema.   ED Results / Procedures / Treatments   Labs (all labs ordered are  listed, but only abnormal results are displayed) Labs Reviewed  CBC - Abnormal; Notable for the following components:      Result Value   Hemoglobin 15.1 (*)    All other components within normal limits  TROPONIN I (HIGH SENSITIVITY) - Abnormal; Notable for the following components:   Troponin I (High Sensitivity) 2,374 (*)    All other components within normal limits  TROPONIN I (HIGH SENSITIVITY) - Abnormal; Notable for the following components:   Troponin I (High Sensitivity) 2,321 (*)    All other components within normal limits  BASIC METABOLIC PANEL WITH GFR  PROTIME-INR  APTT  HEPARIN  LEVEL (UNFRACTIONATED)  HIV ANTIBODY (ROUTINE TESTING W REFLEX)     EKG  ED ECG REPORT I, Waylon Cassis, the attending physician, personally viewed and interpreted this ECG.  Date: 01/24/2024 EKG Time: 1105 Rate: 63 Rhythm: normal sinus rhythm QRS Axis: normal Intervals: normal ST/T Wave abnormalities: Nonspecific T wave abnormalities Narrative Interpretation: no evidence of acute ischemia   ED ECG  REPORT I, Waylon Cassis, the attending physician, personally viewed and interpreted this ECG.  Date: 01/24/2024 EKG Time: 1353 Rate: 66 Rhythm: normal sinus rhythm QRS Axis: normal Intervals: Borderline prolonged QTc ST/T Wave abnormalities: No ST elevation; no dynamic changes when compared to EKG of 1105 today Narrative Interpretation: no evidence of acute ischemia   RADIOLOGY  Chest x-ray: I independently viewed and interpreted the images; there is no focal consolidation or edema  PROCEDURES:  Critical Care performed: Yes, see critical care procedure note(s)  .Critical Care  Performed by: Cassis Waylon, MD Authorized by: Cassis Waylon, MD   Critical care provider statement:    Critical care time (minutes):  30   Critical care time was exclusive of:  Separately billable procedures and treating other patients   Critical care was necessary to treat or  prevent imminent or life-threatening deterioration of the following conditions:  Cardiac failure   Critical care was time spent personally by me on the following activities:  Development of treatment plan with patient or surrogate, discussions with consultants, evaluation of patient's response to treatment, examination of patient, ordering and review of laboratory studies, ordering and review of radiographic studies, ordering and performing treatments and interventions, pulse oximetry, re-evaluation of patient's condition, review of old charts and obtaining history from patient or surrogate   Care discussed with: admitting provider      MEDICATIONS ORDERED IN ED: Medications  nitroGLYCERIN  (NITROGLYN) 2 % ointment 1 inch (1 inch Topical Given 01/24/24 1421)  heparin  ADULT infusion 100 units/mL (25000 units/250mL) (800 Units/hr Intravenous New Bag/Given 01/24/24 1510)  acetaminophen  (TYLENOL ) tablet 650 mg (has no administration in time range)  aspirin  EC tablet 81 mg (has no administration in time range)  propranolol  (INDERAL ) tablet 20 mg (has no administration in time range)  rosuvastatin  (CRESTOR ) tablet 20 mg (has no administration in time range)  albuterol  (PROVENTIL ) (2.5 MG/3ML) 0.083% nebulizer solution 2.5 mg (has no administration in time range)  nitroGLYCERIN  (NITROSTAT ) SL tablet 0.4 mg (has no administration in time range)  ondansetron  (ZOFRAN ) injection 4 mg (has no administration in time range)  hydrALAZINE  (APRESOLINE ) injection 5 mg (has no administration in time range)  lisinopril  (ZESTRIL ) tablet 10 mg (has no administration in time range)  aspirin  chewable tablet 324 mg (324 mg Oral Given 01/24/24 1421)  heparin  bolus via infusion 4,000 Units (4,000 Units Intravenous Bolus from Bag 01/24/24 1510)     IMPRESSION / MDM / ASSESSMENT AND PLAN / ED COURSE  I reviewed the triage vital signs and the nursing notes.  68 year old female with PMH as noted above presents with acute onset  of chest pain this morning although she had some milder chest pain yesterday.  Physical exam is unremarkable for acute findings other than hypertension.  EKG is nonischemic.  Differential diagnosis includes, but is not limited to, ACS, musculoskeletal pain, GERD, less likely aortic dissection or other vascular etiology.  There is no clinical evidence for PE.  Initial troponin is significant elevated consistent with NSTEMI.  Repeat EKG continues to show no ST elevations.  I started the patient on heparin  and gave aspirin .  I consulted Dr. Gollan from cardiology who agreed with the current management and stated he would evaluate the patient.  BMP and CBC are unremarkable.  The patient is comfortable appearing on exam.  I consulted Dr. Laurita from the hospitalist service; based on our discussion he agrees to evaluate the patient for admission.  Patient's presentation is most consistent with acute presentation with potential threat  to life or bodily function.  The patient is on the cardiac monitor to evaluate for evidence of arrhythmia and/or significant heart rate changes.   FINAL CLINICAL IMPRESSION(S) / ED DIAGNOSES   Final diagnoses:  NSTEMI (non-ST elevated myocardial infarction) (HCC)     Rx / DC Orders   ED Discharge Orders     None        Note:  This document was prepared using Dragon voice recognition software and may include unintentional dictation errors.    Jacolyn Pae, MD 01/24/24 [phone]

## 2024-01-24 NOTE — Consult Note (Addendum)
 Cardiology Consultation   Patient ID: Jill Reilly MRN: [id number]; DOB: [birth year]  Admit date: 01/24/2024 Date of Consult: 01/24/2024  PCP:  Lenon Layman ORN, MD    HeartCare Providers Cardiologist:  New   Patient Profile: Jill Reilly is a 68 y.o. female with a hx of h/o breast cancer, carotid artery disease, OSA, anxiety, DM2 who is being seen 01/24/2024 for the evaluation of NSTEMI at the request of Dr. Frederik.  History of Present Illness: Jill Reilly has not seen cardiology in the past. No tobacco, alcohol or drug history. No family h/o CAD. At baseline, patient does no formal activity.   Patient presented to the ER 01/24/2024 with chest pain.  She was sent over from [facility] primary care.  Chest pain started at 830 AM while reading a book.it felt like a chest pressure, like someone was sitting on her chest. It went into her upper back and into her neck. Breathing was a little short. She had an episode of chest pain yesterday as well. Chest pain was up to 8/10.  In the ER BP 169/100, 100% on RA, RR 16, HR 62, afebrile. CXR negative. hs troponin 2374>2321, Hgb 15.1, WBC 7, normal BMET. EKG showed NSR and no ischemic changes. The patient was given SL NTG, started on IV heparin  and admitted for further work-up.    Past Medical History:  Diagnosis Date   Anxiety    Arthritis    FINGERS   Cancer (HCC) 03/2016   left breast   GERD (gastroesophageal reflux disease)    Headache    H/O MIGRAINES   Personal history of radiation therapy 2017/2018   left breast ca    Past Surgical History:  Procedure Laterality Date   BILATERAL CARPAL TUNNEL RELEASE     BREAST BIOPSY Left 03/31/2016   invasive mammary   BREAST LUMPECTOMY Left 04/29/2016   invasive mammary carcinoma. Clear margins   CATARACT EXTRACTION W/PHACO Left 08/10/2022   Procedure: CATARACT EXTRACTION PHACO AND INTRAOCULAR LENS PLACEMENT (IOC) LEFT  CLAREON TORIC LENS  6.33  00:54.6;  Surgeon: Mittie Gaskin,  MD;  Location: [facility];  Service: Ophthalmology;  Laterality: Left;   CATARACT EXTRACTION W/PHACO Right 08/31/2022   Procedure: CATARACT EXTRACTION PHACO AND INTRAOCULAR LENS PLACEMENT (IOC) RIGHT CLAREON TORIC 7.78 00:51.5;  Surgeon: Mittie Gaskin, MD;  Location: [facility];  Service: Ophthalmology;  Laterality: Right;   CESAREAN SECTION     CHOLECYSTECTOMY     PARTIAL MASTECTOMY WITH NEEDLE LOCALIZATION Left 04/29/2016   Procedure: PARTIAL MASTECTOMY WITH NEEDLE LOCALIZATION;  Surgeon: Larinda Unknown Sharps, MD;  Location: [facility];  Service: General;  Laterality: Left;   SENTINEL NODE BIOPSY Left 04/29/2016   Procedure: SENTINEL NODE BIOPSY;  Surgeon: Larinda Unknown Sharps, MD;  Location: [facility];  Service: General;  Laterality: Left;     Home Medications:  Prior to Admission medications   Medication Sig Start Date End Date Taking? Authorizing Provider  acetaminophen  (TYLENOL ) 500 MG tablet Take 1,000 mg by mouth every 6 (six) hours as needed for moderate pain or headache.   Yes [provider]  acidophilus (RISAQUAD) CAPS capsule Take by mouth daily.   Yes [provider]  propranolol  (INDERAL ) 40 MG tablet Take 20 mg by mouth 1 day or 1 dose.   Yes [provider]  rosuvastatin  (CRESTOR ) 20 MG tablet Take 20 mg by mouth daily. 09/18/17 01/24/24 Yes [provider]  Semaglutide, 2 MG/DOSE, 8 MG/3ML SOPN Inject 2 mg into  the skin.  Inject 0.75 mLs (2 mg total) subcutaneously once a week 05/05/23  Yes [provider]  albuterol  (VENTOLIN  HFA) 108 (90 Base) MCG/ACT inhaler Inhale 2 puffs into the lungs every 4 (four) hours as needed. Patient not taking: Reported on 01/24/2024 02/08/23   Brimage, Vondra, DO  aspirin  EC 81 MG tablet Take 81 mg by mouth daily. Patient not taking: Reported on 01/24/2024    [provider]  azelastine (ASTELIN) 0.1 % nasal spray Place into the nose. 06/26/18 06/26/19  [provider]   benzonatate  (TESSALON ) 100 MG capsule Take 1 capsule (100 mg total) by mouth every 8 (eight) hours. Patient not taking: Reported on 01/24/2024 02/08/23   Brimage, Vondra, DO  Ca Carbonate-Mag Hydroxide (ROLAIDS PO) Take 1 tablet by mouth as needed ((stomach acid)).  Patient not taking: Reported on 01/24/2024    [provider]  cetirizine (ZYRTEC) 10 MG tablet Take 1 tablet by mouth daily as needed for allergies. Patient not taking: Reported on 01/24/2024    [provider]  clonazePAM (KLONOPIN) 0.5 MG tablet Take by mouth. 10/26/18 11/25/18  [provider]  Cyanocobalamin (VITAMIN B 12) 500 MCG TABS Take 1,000 1e11 [employer] by mouth 1 day or 1 dose. Patient not taking: Reported on 01/24/2024    [provider]  fluticasone (FLONASE) 50 MCG/ACT nasal spray Place 1 spray into both nostrils daily. Patient not taking: Reported on 01/24/2024    [provider]  ibuprofen  (ADVIL ,MOTRIN ) 200 MG tablet Take 400 mg by mouth every 6 (six) hours as needed for headache or moderate pain. Patient not taking: Reported on 01/24/2024    [provider]  letrozole  (FEMARA ) 2.5 MG tablet TAKE ONE TABLET BY MOUTH EVERY DAY Patient not taking: Reported on 01/24/2024 09/06/18   Jacobo Evalene PARAS, MD  predniSONE  (STERAPRED UNI-PAK 21 TAB) 10 MG (21) TBPK tablet Take by mouth daily. Take 6 tabs by mouth daily for 1, then 5 tabs for 1 day, then 4 tabs for 1 day, then 3 tabs for 1 day, then 2 tabs for 1 day, then 1 tab for 1 day. Patient not taking: Reported on 01/24/2024 02/08/23   Brimage, Vondra, DO  promethazine -dextromethorphan (PROMETHAZINE -DM) 6.25-15 MG/5ML syrup Take 5 mLs by mouth 4 (four) times daily as needed. Patient not taking: Reported on 01/24/2024 02/08/23   Brimage, Vondra, DO    Scheduled Meds:  [START ON 01/25/2024] aspirin  EC  81 mg Oral Daily   lisinopril   10 mg Oral Daily   nitroGLYCERIN   1 inch Topical Q6H   [START ON 01/25/2024] propranolol   20 mg Oral  Daily   [START ON 01/25/2024] rosuvastatin   20 mg Oral Daily   Continuous Infusions:  heparin  800 Units/hr (01/24/24 1510)   PRN Meds: acetaminophen , albuterol , hydrALAZINE , iohexol , nitroGLYCERIN , ondansetron  (ZOFRAN ) IV  Allergies:   No Known Allergies  Social History:   Social History   Socioeconomic History   Marital status: Married    Spouse name: Not on file   Number of children: Not on file   Years of education: Not on file   Highest education level: Not on file  Occupational History   Not on file  Tobacco Use   Smoking status: Never   Smokeless tobacco: Never  Vaping Use   Vaping status: Never Used  Substance and Sexual Activity   Alcohol use: Yes    Comment: occasional   Drug use: No   Sexual activity: Yes  Other Topics Concern  Not on file  Social History Narrative   Not on file   Social Drivers of Health   Financial Resource Strain: Low Risk  (08/10/2023)   Received from [facility]   Overall Financial Resource Strain (CARDIA)    Difficulty of Paying Living Expenses: Not hard at all  Food Insecurity: No Food Insecurity (08/10/2023)   Received from [facility]   Hunger Vital Sign    Within the past 12 months, you worried that your food would run out before you got the money to buy more.: Never true    Within the past 12 months, the food you bought just didn't last and you didn't have money to get more.: Never true  Transportation Needs: No Transportation Needs (08/10/2023)   Received from [facility] - Transportation    In the past 12 months, has lack of transportation kept you from medical appointments or from getting medications?: No    Lack of Transportation (Non-Medical): No  Physical Activity: Inactive (10/15/2017)   Exercise Vital Sign    Days of Exercise per Week: 0 days    Minutes of Exercise per Session: 0 min  Stress: No Stress Concern Present (10/15/2017)   [employer] of  Occupational Health - Occupational Stress Questionnaire    Feeling of Stress : Not at all  Social Connections: Unknown (10/15/2017)   Social Connection and Isolation Panel    Frequency of Communication with Friends and Family: Never    Frequency of Social Gatherings with Friends and Family: Never    Attends Religious Services: Never    Database administrator or Organizations: Patient declined    Attends Banker Meetings: Patient declined    Marital Status: Patient declined  Intimate Partner Violence: Unknown (10/15/2017)   Humiliation, Afraid, Rape, and Kick questionnaire    Fear of Current or Ex-Partner: Patient declined    Emotionally Abused: Patient declined    Physically Abused: Patient declined    Sexually Abused: Patient declined    Family History:    Family History  Problem Relation Age of Onset   Breast cancer Neg Hx      ROS:  Please see the history of present illness.   All other ROS reviewed and negative.     Physical Exam/Data: Vitals:   01/24/24 1400 01/24/24 1430 01/24/24 1512 01/24/24 1537  BP: (!) 163/101 (!) 161/129  (!) 159/99  Pulse: 65 65    Resp: (!) 22 19    Temp:   98.2 F (36.8 C)   TempSrc:   Oral   SpO2: 100% 99%    Weight:      Height:       No intake or output data in the 24 hours ending 01/24/24 1632    01/24/2024   11:04 AM 02/08/2023    9:47 AM 08/31/2022    9:10 AM  Last 3 Weights  Weight (lbs) 175 lb 200 lb 201 lb  Weight (kg) 79.379 kg 90.719 kg 91.173 kg     Body mass index is 31 kg/m.  General:  Well nourished, well developed, in no acute distress HEENT: normal Neck: no JVD Vascular: No carotid bruits; Distal pulses 2+ bilaterally Cardiac:  normal S1, S2; RRR; no murmur  Lungs:  clear to auscultation bilaterally, no wheezing, rhonchi or rales  Abd: soft, nontender, no hepatomegaly  Ext: no edema Musculoskeletal:  No deformities, BUE and BLE strength normal and equal Skin: warm and dry  Neuro:  CNs 2-12 intact,  no focal abnormalities noted Psych:  Normal affect   EKG:  The EKG was personally reviewed and demonstrates:  NSR, 66bpm TWI AVL and V3.  Telemetry:  Telemetry was personally reviewed and demonstrates:  SR Hr 50-60s  Relevant CV Studies:  Echo 2019 Study Conclusions   - Left ventricle: The cavity size was normal. Wall thickness was    normal. Systolic function was normal. The estimated ejection    fraction was in the range of 55% to 60%. Wall motion was normal;    there were no regional wall motion abnormalities. Left    ventricular diastolic function parameters were normal.  - Mitral valve: There was mild to moderate regurgitation.  - Atrial septum: A small septal defect cannot be excluded. Doubt    clinical significance given normal size of right sided chambers.   Laboratory Data: High Sensitivity Troponin:   Recent Labs  Lab 01/24/24 1106 01/24/24 1401  TROPONINIHS 2,374* 2,321*     Chemistry Recent Labs  Lab 01/24/24 1106  NA 140  K 4.3  CL 106  CO2 24  GLUCOSE 98  BUN 11  CREATININE 0.80  CALCIUM  9.3  GFRNONAA >60  ANIONGAP 10    No results for input(s): PROT, ALBUMIN, AST, ALT, ALKPHOS, BILITOT in the last 168 hours. Lipids No results for input(s): CHOL, TRIG, HDL, LABVLDL, LDLCALC, CHOLHDL in the last 168 hours.  Hematology Recent Labs  Lab 01/24/24 1106  WBC 7.0  RBC 4.99  HGB 15.1*  HCT 44.4  MCV 89.0  MCH 30.3  MCHC 34.0  RDW 12.2  PLT 246   Thyroid No results for input(s): TSH, FREET4 in the last 168 hours.  BNPNo results for input(s): BNP, PROBNP in the last 168 hours.  DDimer No results for input(s): DDIMER in the last 168 hours.  Radiology/Studies:  DG Chest 2 View Result Date: 01/24/2024 CLINICAL DATA:  Chest pain. EXAM: CHEST - 2 VIEW COMPARISON:  February 08, 2023. FINDINGS: The heart size and mediastinal contours are within normal limits. Both lungs are clear. The visualized skeletal structures are  unremarkable. IMPRESSION: No active cardiopulmonary disease. Electronically Signed   By: Lynwood Landy Raddle M.D.   On: 01/24/2024 11:43     Assessment and Plan:  NSTEMI - presented with chest pain found to have elevated troponin stated on IV heparin  - HS trop elevated >2000 - continue ASA, Crestor  20mg  daily, Propranolol  20mg  daily - A1c and lipid panel. Recent LDL 46. - echo ordered - Chest CTA ordered to r/o dissection - continue  IV heparin  - she will need a heart cath tomorrow. NPO at midnight  Risks and benefits of cardiac catheterization have been discussed with the patient.  These include bleeding, infection, kidney damage, stroke, heart attack, death.  The patient understands these risks and is willing to proceed.   HTN emergency - BP elevated on admission 166/95 - started on lisinopril  10mg  daily - propranolol  20mg  daily continued  DM2 - per IM   Risk Assessment/Risk Scores:    TIMI Risk Score for Unstable Angina or Non-ST Elevation MI:   The patient's TIMI risk score is 3, which indicates a 13% risk of all cause mortality, new or recurrent myocardial infarction or need for urgent revascularization in the next 14 days.   For questions or updates, please contact [facility] Please consult [email or web address] for contact info under    Signed, Jackelyn Illingworth VEAR Fishman, PA-C  01/24/2024 4:32 PM

## 2024-01-25 ENCOUNTER — Inpatient Hospital Stay (HOSPITAL_BASED_OUTPATIENT_CLINIC_OR_DEPARTMENT_OTHER)
Admit: 2024-01-25 | Discharge: 2024-01-25 | Disposition: A | Discharge status: Home / Self Care | Attending: Cardiovascular Disease | Admitting: Cardiovascular Disease

## 2024-01-25 ENCOUNTER — Other Ambulatory Visit: Payer: Self-pay

## 2024-01-25 ENCOUNTER — Encounter
Admission: EM | Disposition: A | Discharge status: Home / Self Care | Payer: Self-pay | Source: Ambulatory Visit | Attending: Emergency Medicine

## 2024-01-25 DIAGNOSIS — I214 Non-ST elevation (NSTEMI) myocardial infarction: Secondary | ICD-10-CM

## 2024-01-25 DIAGNOSIS — E785 Hyperlipidemia, unspecified: Secondary | ICD-10-CM | POA: Diagnosis not present

## 2024-01-25 DIAGNOSIS — I25110 Atherosclerotic heart disease of native coronary artery with unstable angina pectoris: Secondary | ICD-10-CM

## 2024-01-25 HISTORY — PX: LEFT HEART CATH AND CORONARY ANGIOGRAPHY: CATH118249

## 2024-01-25 LAB — ECHOCARDIOGRAM COMPLETE
AR max vel: 3.48 cm2
AV Area VTI: 3.58 cm2
AV Area mean vel: 3.6 cm2
AV Mean grad: 2 mmHg
AV Peak grad: 3.4 mmHg
Ao pk vel: 0.92 m/s
Area-P 1/2: 5.75 cm2
Height: 63 in
S' Lateral: 3.4 cm
Weight: 2800 [oz_av]

## 2024-01-25 LAB — CBC
HCT: 43 % (ref 36.0–46.0)
Hemoglobin: 14.7 g/dL (ref 12.0–15.0)
MCH: 30.8 pg (ref 26.0–34.0)
MCHC: 34.2 g/dL (ref 30.0–36.0)
MCV: 90.1 fL (ref 80.0–100.0)
Platelets: 178 K/uL (ref 150–400)
RBC: 4.77 MIL/uL (ref 3.87–5.11)
RDW: 12.5 % (ref 11.5–15.5)
WBC: 5.3 K/uL (ref 4.0–10.5)
nRBC: 0 % (ref 0.0–0.2)

## 2024-01-25 LAB — MAGNESIUM: Magnesium: 2.5 mg/dL — ABNORMAL HIGH (ref 1.7–2.4)

## 2024-01-25 LAB — HEPARIN LEVEL (UNFRACTIONATED)
Heparin Unfractionated: 0.31 [IU]/mL (ref 0.30–0.70)
Heparin Unfractionated: <0.1 [IU]/mL — ABNORMAL LOW (ref 0.30–0.70)

## 2024-01-25 LAB — HIV ANTIBODY (ROUTINE TESTING W REFLEX): HIV Screen 4th Generation wRfx: NONREACTIVE

## 2024-01-25 SURGERY — LEFT HEART CATH AND CORONARY ANGIOGRAPHY
Anesthesia: Moderate Sedation

## 2024-01-25 MED ORDER — SODIUM CHLORIDE 0.9% FLUSH
3.0000 mL | Freq: Two times a day (BID) | INTRAVENOUS | Status: DC
  Administered 2024-01-25: 3 mL via INTRAVENOUS

## 2024-01-25 MED ORDER — HEPARIN SODIUM (PORCINE) 1000 UNIT/ML IJ SOLN
INTRAMUSCULAR | Status: DC | PRN
  Administered 2024-01-25: 4000 [IU] via INTRAVENOUS

## 2024-01-25 MED ORDER — FREE WATER: 500.0000 mL | Freq: Once | Status: DC

## 2024-01-25 MED ORDER — IOHEXOL 300 MG/ML  SOLN
INTRAMUSCULAR | Status: DC | PRN
  Administered 2024-01-25: 74 mL

## 2024-01-25 MED ORDER — ACETAMINOPHEN 325 MG PO TABS
ORAL_TABLET | ORAL | Status: AC
  Filled 2024-01-25: qty 2

## 2024-01-25 MED ORDER — ASPIRIN 81 MG PO CHEW
81.0000 mg | CHEWABLE_TABLET | ORAL | Status: AC
  Administered 2024-01-25: 81 mg via ORAL
  Filled 2024-01-25: qty 1

## 2024-01-25 MED ORDER — MIDAZOLAM HCL 2 MG/2ML IJ SOLN
INTRAMUSCULAR | Status: DC | PRN
  Administered 2024-01-25: 1 mg via INTRAVENOUS

## 2024-01-25 MED ORDER — ASPIRIN 81 MG PO CHEW: 81.0000 mg | CHEWABLE_TABLET | ORAL | Status: DC

## 2024-01-25 MED ORDER — HEPARIN SODIUM (PORCINE) 1000 UNIT/ML IJ SOLN
INTRAMUSCULAR | Status: AC
  Filled 2024-01-25: qty 10

## 2024-01-25 MED ORDER — DEXAMETHASONE SODIUM PHOSPHATE 10 MG/ML IJ SOLN
10.0000 mg | Freq: Once | INTRAMUSCULAR | Status: AC
  Administered 2024-01-25: 10 mg via INTRAVENOUS
  Filled 2024-01-25: qty 1

## 2024-01-25 MED ORDER — DIPHENHYDRAMINE HCL 50 MG/ML IJ SOLN
25.0000 mg | Freq: Once | INTRAMUSCULAR | Status: AC
  Administered 2024-01-25: 25 mg via INTRAVENOUS
  Filled 2024-01-25: qty 1

## 2024-01-25 MED ORDER — FENTANYL CITRATE (PF) 100 MCG/2ML IJ SOLN
INTRAMUSCULAR | Status: AC
  Filled 2024-01-25: qty 2

## 2024-01-25 MED ORDER — SODIUM CHLORIDE 0.9% FLUSH: 3.0000 mL | INTRAVENOUS | Status: DC | PRN

## 2024-01-25 MED ORDER — SODIUM CHLORIDE 0.9 % IV SOLN
250.0000 mL | INTRAVENOUS | Status: DC | PRN
Start: 2024-01-25 — End: 2024-01-26

## 2024-01-25 MED ORDER — MAGNESIUM SULFATE IN D5W 1-5 GM/100ML-% IV SOLN
1.0000 g | Freq: Once | INTRAVENOUS | Status: AC
  Administered 2024-01-25: 1 g via INTRAVENOUS
  Filled 2024-01-25: qty 100

## 2024-01-25 MED ORDER — FREE WATER
500.0000 mL | Freq: Once | Status: DC
  Filled 2024-01-25: qty 500

## 2024-01-25 MED ORDER — SODIUM CHLORIDE 0.9 % IV SOLN: INTRAVENOUS | Status: DC

## 2024-01-25 MED ORDER — HEPARIN (PORCINE) IN NACL 1000-0.9 UT/500ML-% IV SOLN
INTRAVENOUS | Status: AC
  Filled 2024-01-25: qty 1000

## 2024-01-25 MED ORDER — CLOPIDOGREL BISULFATE 75 MG PO TABS
75.0000 mg | ORAL_TABLET | Freq: Every day | ORAL | Status: DC
  Administered 2024-01-26: 75 mg via ORAL
  Filled 2024-01-25: qty 1

## 2024-01-25 MED ORDER — HEPARIN BOLUS VIA INFUSION
2100.0000 [IU] | Freq: Once | INTRAVENOUS | Status: DC
  Filled 2024-01-25: qty 2100

## 2024-01-25 MED ORDER — VERAPAMIL HCL 2.5 MG/ML IV SOLN
INTRAVENOUS | Status: AC
  Filled 2024-01-25: qty 2

## 2024-01-25 MED ORDER — LIDOCAINE HCL (PF) 1 % IJ SOLN
INTRAMUSCULAR | Status: DC | PRN
  Administered 2024-01-25: 2 mL

## 2024-01-25 MED ORDER — HEPARIN (PORCINE) IN NACL 1000-0.9 UT/500ML-% IV SOLN
INTRAVENOUS | Status: DC | PRN
  Administered 2024-01-25 (×2): 500 mL

## 2024-01-25 MED ORDER — FENTANYL CITRATE (PF) 100 MCG/2ML IJ SOLN
INTRAMUSCULAR | Status: DC | PRN
  Administered 2024-01-25: 25 ug via INTRAVENOUS

## 2024-01-25 MED ORDER — ENOXAPARIN SODIUM 40 MG/0.4ML IJ SOSY: 40.0000 mg | PREFILLED_SYRINGE | Freq: Every evening | INTRAMUSCULAR | Status: DC

## 2024-01-25 MED ORDER — LIDOCAINE HCL 1 % IJ SOLN
INTRAMUSCULAR | Status: AC
  Filled 2024-01-25: qty 20

## 2024-01-25 MED ORDER — VERAPAMIL HCL 2.5 MG/ML IV SOLN
INTRAVENOUS | Status: DC | PRN
  Administered 2024-01-25: 2.5 mg via INTRA_ARTERIAL

## 2024-01-25 MED ORDER — SODIUM CHLORIDE 0.9 % IV BOLUS
250.0000 mL | Freq: Once | INTRAVENOUS | Status: AC
  Administered 2024-01-25: 250 mL via INTRAVENOUS

## 2024-01-25 MED ORDER — MIDAZOLAM HCL 2 MG/2ML IJ SOLN
INTRAMUSCULAR | Status: AC
Start: 2024-01-25 — End: 2024-01-25
  Filled 2024-01-25: qty 2

## 2024-01-25 SURGICAL SUPPLY — 10 items
CATH INFINITI 5 FR JL3.5 (CATHETERS) IMPLANT
CATH INFINITI AMBI 5FR JK (CATHETERS) IMPLANT
CATH INFINITI JR4 5F (CATHETERS) IMPLANT
DEVICE RAD TR BAND REGULAR (VASCULAR PRODUCTS) IMPLANT
DRAPE BRACHIAL (DRAPES) IMPLANT
GLIDESHEATH SLEND SS 6F .021 (SHEATH) IMPLANT
GUIDEWIRE INQWIRE 1.5J.035X260 (WIRE) IMPLANT
PACK CARDIAC CATH (CUSTOM PROCEDURE TRAY) ×1 IMPLANT
SET ATX-X65L (MISCELLANEOUS) IMPLANT
STATION PROTECTION PRESSURIZED (MISCELLANEOUS) IMPLANT

## 2024-01-25 NOTE — H&P (View-Only) (Signed)
  Progress Note  Patient Name: SHAWNELL DYKES Date of Encounter: 01/25/2024 [facility] Cardiologist: New  Interval Summary    Blood pressure soft overnight. No further chest pain. No SOB. Plan for LHC today.   Vital Signs Vitals:   01/24/24 2312 01/25/24 0444 01/25/24 0636 01/25/24 0650  BP:   (!) 76/59 (!) 94/59  Pulse: 62 60 60 (!) 57  Resp: 17 20 16 18   Temp:  97.9 F (36.6 C)    TempSrc:      SpO2: 94% 93% 95% 96%  Weight:      Height:       No intake or output data in the 24 hours ending 01/25/24 0755    01/24/2024   11:04 AM 02/08/2023    9:47 AM 08/31/2022    9:10 AM  Last 3 Weights  Weight (lbs) 175 lb 200 lb 201 lb  Weight (kg) 79.379 kg 90.719 kg 91.173 kg      Telemetry/ECG  SB/SR HR 50-60s, rare PVC - Personally Reviewed  Physical Exam  GEN: No acute distress.   Neck: No JVD Cardiac: RRR, no murmurs, rubs, or gallops.  Respiratory: Clear to auscultation bilaterally. GI: Soft, nontender, non-distended  MS: No edema  Relevant CV Studies:   Echo 2019 Study Conclusions   - Left ventricle: The cavity size was normal. Wall thickness was    normal. Systolic function was normal. The estimated ejection    fraction was in the range of 55% to 60%. Wall motion was normal;    there were no regional wall motion abnormalities. Left    ventricular diastolic function parameters were normal.  - Mitral valve: There was mild to moderate regurgitation.  - Atrial septum: A small septal defect cannot be excluded. Doubt    clinical significance given normal size of right sided chambers.   Assessment & Plan   NSTEMI - presented with chest pain found to have elevated troponin stated on IV heparin  - HS trop elevated >2000 - continue ASA, Crestor  20mg  daily, Propranolol  20mg  daily - Recent LDL 46. - echo ordered - Chest CTA with no dissection - continue  IV heparin  - plan for heart cath today.    HTN emergency - BP elevated on admission 166/95 - started  on lisinopril  10mg  daily, but she was hypotensive overnight so we will hold this - propranolol  20mg  daily continued   DM2 - per IM    For questions or updates, please contact [facility] Please consult [email or web address] for contact info under       Signed, Ezekial Arns VEAR Fishman, PA-C

## 2024-01-25 NOTE — Consult Note (Signed)
 PHARMACY - ANTICOAGULATION CONSULT NOTE  Pharmacy Consult for heparin  infusion Indication: chest pain/ACS  No Known Allergies  Patient Measurements: Height: 5' 3 (160 cm) Weight: 79.4 kg (175 lb) IBW/kg (Calculated) : 52.4 HEPARIN  DW (KG): 69.7  Vital Signs: Temp: 97.9 F (36.6 C) (09/03 2215) Temp Source: Oral (09/03 2215) BP: 106/63 (09/03 2215) Pulse Rate: 62 (09/03 2312)  Labs: Recent Labs    01/24/24 1106 01/24/24 1401 01/25/24 0046  HGB 15.1*  --   --   HCT 44.4  --   --   PLT 246  --   --   APTT  --  25  --   LABPROT  --  13.7  --   INR  --  1.0  --   HEPARINUNFRC  --   --  0.31  CREATININE 0.80  --   --   TROPONINIHS 2,374* 2,321*  --    Estimated Creatinine Clearance: 65.3 mL/min (by C-G formula based on SCr of 0.8 mg/dL).  Medical History: Past Medical History:  Diagnosis Date   Anxiety    Arthritis    FINGERS   Cancer (HCC) 03/2016   left breast   GERD (gastroesophageal reflux disease)    Headache    H/O MIGRAINES   Personal history of radiation therapy 2017/2018   left breast ca   Medications:  No AC prior to admission per med list and fill hx.  Assessment: Patent with PMH including hx of h/o breast cancer, carotid artery disease, OSA, anxiety, DM2. Present to ED with chest pain. Pharmacy consulted to initiative and manage heparin  infusion for NSTEMI. Tropinin 2374  Baseline labs: hgb 15.1, plt 246, INR 1, apTT 25  Goal of Therapy:  Heparin  level 0.3-0.7 units/ml Monitor platelets by anticoagulation protocol: Yes  09/04 0104 HL 0.31, therapeutic x 1   Plan:  Continue heparin  infusion at 800 units/hr Recheck heparin  level in 8 hours to confirm Continue to monitor H&H and platelets  Rankin CANDIE Dills, PharmD, [facility] 01/25/2024 2:06 AM

## 2024-01-25 NOTE — Plan of Care (Signed)
   Problem: Education: Goal: Understanding of cardiac disease, CV risk reduction, and recovery process will improve Outcome: Progressing Goal: Individualized Educational Video(s) Outcome: Progressing   Problem: Activity: Goal: Ability to tolerate increased activity will improve Outcome: Progressing   Problem: Cardiac: Goal: Ability to achieve and maintain adequate cardiovascular perfusion will improve Outcome: Progressing   Problem: Health Behavior/Discharge Planning: Goal: Ability to safely manage health-related needs after discharge will improve Outcome: Progressing   Problem: Education: Goal: Understanding of CV disease, CV risk reduction, and recovery process will improve Outcome: Progressing Goal: Individualized Educational Video(s) Outcome: Progressing   Problem: Activity: Goal: Ability to return to baseline activity level will improve Outcome: Progressing   Problem: Cardiovascular: Goal: Ability to achieve and maintain adequate cardiovascular perfusion will improve Outcome: Progressing Goal: Vascular access site(s) Level 0-1 will be maintained Outcome: Progressing   Problem: Health Behavior/Discharge Planning: Goal: Ability to safely manage health-related needs after discharge will improve Outcome: Progressing   Problem: Education: Goal: Knowledge of General Education information will improve Description: Including pain rating scale, medication(s)/side effects and non-pharmacologic comfort measures Outcome: Progressing   Problem: Health Behavior/Discharge Planning: Goal: Ability to manage health-related needs will improve Outcome: Progressing   Problem: Clinical Measurements: Goal: Ability to maintain clinical measurements within normal limits will improve Outcome: Progressing Goal: Will remain free from infection Outcome: Progressing Goal: Diagnostic test results will improve Outcome: Progressing Goal: Respiratory complications will improve Outcome:  Progressing Goal: Cardiovascular complication will be avoided Outcome: Progressing   Problem: Activity: Goal: Risk for activity intolerance will decrease Outcome: Progressing   Problem: Nutrition: Goal: Adequate nutrition will be maintained Outcome: Progressing   Problem: Coping: Goal: Level of anxiety will decrease Outcome: Progressing   Problem: Elimination: Goal: Will not experience complications related to bowel motility Outcome: Progressing Goal: Will not experience complications related to urinary retention Outcome: Progressing   Problem: Pain Managment: Goal: General experience of comfort will improve and/or be controlled Outcome: Progressing   Problem: Safety: Goal: Ability to remain free from injury will improve Outcome: Progressing   Problem: Skin Integrity: Goal: Risk for impaired skin integrity will decrease Outcome: Progressing

## 2024-01-25 NOTE — Progress Notes (Signed)
 [facility] Progress Note  Patient: Jill Reilly    FMW:[id number]  DOA: 01/24/2024     Date of Service: the patient was seen and examined on 01/25/2024  Chief Complaint  Patient presents with   Chest Pain   Brief hospital course: Ahilyn Nell Kluck is a 68 y.o. female with medical history significant of IIDM on Ozempic, HLD, breast cancer status post lumpectomy and radiation, GERD, presented with new onset chest pain.   Patient started to have a pressure-like chest pain yesterday evening, pressure-like, 5-6/10, lasted for few minutes localized, denied any associated symptoms of shortness of breath palpitations sweating and chest pain subsided by its own.  This morning, patient woke up again with severe pressure-like chest pain this time, the chest pain radiated to both shoulders,upper back and left upper arm and associated with sweating.  EMS arrived found patient blood pressure in 200s.   ED Course: Blood pressure remains high at 169/100, chest x-ray negative for acute findings EKG negative for acute ST changes, troponin/2300, BUN 11 creatinine 0 point glucose 98, WBC 7.0 hemoglobin 15.1.   Patient was given nitroglycerin  and heparin  drip started.   Assessment and Plan:  # Chest pain # NSTEMI CTA chest ruled out dissection and PE. - s/p heparin  drip, Continue aspirin  - Heart rate borderline bradycardia, will hold off beta-blocker - Risk factors modification, check A1c and lipid panel - TTE LVEF 45 to 50%, LV wall motion abnormality, hypokinesis of anterior and anteroseptal wall.  Grade 1 DD, negative PFO Cardiology consulted s/p cardiac cath, nonobstructive CAD, no culprit identified.  It could be due to stress at home. Cardiology recommended to monitor overnight and DC plan tomorrow a.m. Started Plavix  75 mg p.o. daily for 6 to 12 months.  Continue DAPT Follow-up with cardiology tomorrow a.m. for disposition plan.   # HTN emergency - As needed hydralazine  - Start ACEI   #  IIDM - Controlled - Outpatient Ozempic therapy Continue NovoLog sliding scale, monitor CBG, continue diabetic diet   # Migraine headache Patient takes Imitrex which is contraindicated during non-STEMI, continue to hold for 1 week as per cardiology Migraine cocktail including magnesium , Decadron  and Benadryl  x 1 dose given Continue Tylenol  as needed   Body mass index is 31 kg/m.  Interventions:  Diet: Heart healthy diet DVT Prophylaxis: Subcutaneous Lovenox    Advance goals of care discussion: Full code  Family Communication: family was present at bedside, at the time of interview.  The pt provided permission to discuss medical plan with the family. Opportunity was given to ask question and all questions were answered satisfactorily.   Disposition:  Pt is from home, admitted with NSTEMI, s/p Cath today, which precludes a safe discharge. Discharge to home, when stable, most likely tomorrow am if cleared by cards.  Subjective: No significant events overnight.  Patient was admitted overnight due to chest pain which has been resolved after Nitropaste send patient was on heparin  IV infusion.  Patient was seen by cardiology, plan is for cardiac cath patient agreed for that.  Currently patient was asymptomatic except complaining of some headache.  Physical Exam: General: NAD, lying comfortably Appear in no distress, affect appropriate Eyes: PERRLA ENT: Oral Mucosa Clear, moist  Neck: no JVD,  Cardiovascular: S1 and S2 Present, no Murmur,  Respiratory: good respiratory effort, Bilateral Air entry equal and Decreased, no Crackles, no wheezes Abdomen: Bowel Sound present, Soft and no tenderness,  Skin: no rashes Extremities: no Pedal edema, no calf tenderness Neurologic: without any  new focal findings Gait not checked due to patient safety concerns  Vitals:   01/24/24 2312 01/25/24 0444 01/25/24 0636 01/25/24 0650  BP:   (!) 76/59 (!) 94/59  Pulse: 62 60 60 (!) 57  Resp: 17 20 16  18   Temp:  97.9 F (36.6 C)    TempSrc:      SpO2: 94% 93% 95% 96%  Weight:      Height:       No intake or output data in the 24 hours ending 01/25/24 0826 Filed Weights   01/24/24 1104  Weight: 79.4 kg    Data Reviewed: I have personally reviewed and interpreted daily labs, tele strips, imagings as discussed above. I reviewed all nursing notes, pharmacy notes, vitals, pertinent old records I have discussed plan of care as described above with RN and patient/family.  CBC: Recent Labs  Lab 01/24/24 1106  WBC 7.0  HGB 15.1*  HCT 44.4  MCV 89.0  PLT 246   Basic Metabolic Panel: Recent Labs  Lab 01/24/24 1106  NA 140  K 4.3  CL 106  CO2 24  GLUCOSE 98  BUN 11  CREATININE 0.80  CALCIUM  9.3    Studies: CT ANGIO CHEST AORTA W/CM &/OR WO/CM Result Date: 01/24/2024 CLINICAL DATA:  Chest pain. Diabetes. History of left lumpectomy for breast cancer. EXAM: CT ANGIOGRAPHY CHEST WITH CONTRAST TECHNIQUE: Multidetector CT imaging of the chest was performed using the standard protocol prior to and during bolus administration of intravenous contrast. Multiplanar CT image reconstructions and MIPs were obtained to evaluate the vascular anatomy. RADIATION DOSE REDUCTION: This exam was performed according to the departmental dose-optimization program which includes automated exposure control, adjustment of the mA and/or kV according to patient size and/or use of iterative reconstruction technique. CONTRAST:  75mL OMNIPAQUE  IOHEXOL  350 MG/ML SOLN COMPARISON:  Chest radiograph 01/24/2024 FINDINGS: Cardiovascular: Initial noncontrast images demonstrate no hyperdensity along the thoracic aortic wall to indicate acute intramural hematoma. There is minimal atheromatous vascular calcification of the aortic arch and left anterior descending coronary artery. No aortic dissection or acute aortic findings. Normal branch vasculature. Patent proximal vertebral arteries. No appreciable filling defect in the  pulmonary arterial tree. Mediastinum/Nodes: Unremarkable Lungs/Pleura: Unremarkable Upper Abdomen: Numerous scattered colonic diverticula. Musculoskeletal: Lower cervical and thoracic spondylosis. Review of the MIP images confirms the above findings. IMPRESSION: 1. No acute thoracic aortic findings. 2. Minimal atheromatous vascular calcification of the aortic arch and left anterior descending coronary artery. Aortic Atherosclerosis (ICD10-I70.0). 3. Numerous scattered colonic diverticula. 4. Lower cervical and thoracic spondylosis. Electronically Signed   By: Ryan Salvage M.D.   On: 01/24/2024 17:33   DG Chest 2 View Result Date: 01/24/2024 CLINICAL DATA:  Chest pain. EXAM: CHEST - 2 VIEW COMPARISON:  February 08, 2023. FINDINGS: The heart size and mediastinal contours are within normal limits. Both lungs are clear. The visualized skeletal structures are unremarkable. IMPRESSION: No active cardiopulmonary disease. Electronically Signed   By: Lynwood Landy Raddle M.D.   On: 01/24/2024 11:43    Scheduled Meds:  aspirin   81 mg Oral Pre-Cath   aspirin  EC  81 mg Oral Daily   free water   500 mL Oral Once   nitroGLYCERIN   1 inch Topical Q6H   propranolol   20 mg Oral Daily   rosuvastatin   20 mg Oral Daily   Continuous Infusions:  heparin  800 Units/hr (01/24/24 1510)   PRN Meds: acetaminophen , albuterol , hydrALAZINE , nitroGLYCERIN , ondansetron  (ZOFRAN ) IV  Time spent: 35 minutes  Author: ELVAN SOR.  MD [facility] 01/25/2024 8:26 AM  To reach On-call, see care teams to locate the attending and reach out to them via [email or web address]. If 7PM-7AM, please contact night-coverage If you still have difficulty reaching the attending provider, please page the [facility] (Director on Call) for [facility] on amion for assistance.

## 2024-01-25 NOTE — Consult Note (Addendum)
 PHARMACY - ANTICOAGULATION CONSULT NOTE  Pharmacy Consult for heparin  infusion Indication: chest pain/ACS  No Known Allergies  Patient Measurements: Height: 5' 3 (160 cm) Weight: 79.4 kg (175 lb) IBW/kg (Calculated) : 52.4 HEPARIN  DW (KG): 69.7  Vital Signs: Temp: 98.1 F (36.7 C) (09/04 0908) Temp Source: Oral (09/04 0908) BP: 126/68 (09/04 0800) Pulse Rate: 63 (09/04 0800)  Labs: Recent Labs    01/24/24 1106 01/24/24 1401 01/25/24 0046 01/25/24 0854  HGB 15.1*  --   --  14.7  HCT 44.4  --   --  43.0  PLT 246  --   --  178  APTT  --  25  --   --   LABPROT  --  13.7  --   --   INR  --  1.0  --   --   HEPARINUNFRC  --   --  0.31 <0.10*  CREATININE 0.80  --   --   --   TROPONINIHS 2,374* 2,321*  --   --    Estimated Creatinine Clearance: 65.3 mL/min (by C-G formula based on SCr of 0.8 mg/dL).  Medical History: Past Medical History:  Diagnosis Date   Anxiety    Arthritis    FINGERS   Cancer (HCC) 03/2016   left breast   GERD (gastroesophageal reflux disease)    Headache    H/O MIGRAINES   Personal history of radiation therapy 2017/2018   left breast ca   Medications:  No AC prior to admission per med list and fill hx.  Assessment: Patent with PMH including hx of h/o breast cancer, carotid artery disease, OSA, anxiety, DM2. Present to ED with chest pain. Plans for Left heart cath on 9/4. Pharmacy consulted to initiative and manage heparin  infusion for NSTEMI. Tropinin 2374  Baseline labs: hgb 15.1, plt 246, INR 1, apTT 25  Goal of Therapy:  Heparin  level 0.3-0.7 units/ml Monitor platelets by anticoagulation protocol: Yes  09/04 0104 HL 0.31, therapeutic x 1 09/04 0854 HL <0.10, subtherapeutic   Plan:  Heparin  level is subtherapeutic Give 2,100 unit bolus x1 then increase infusion to 1000 units/hr Recheck heparin  level in 8 hours to confirm Continue to monitor H&H and platelets  Annabella LOISE Banks, PharmD Clinical Pharmacist 01/25/2024 10:29 AM

## 2024-01-25 NOTE — Interval H&P Note (Signed)
 History and Physical Interval Note:  01/25/2024 12:57 PM  Jill Reilly  has presented today for surgery, with the diagnosis of chest pain.  The various methods of treatment have been discussed with the patient and family. After consideration of risks, benefits and other options for treatment, the patient has consented to  Procedure(s): LEFT HEART CATH AND CORONARY ANGIOGRAPHY (N/A) as a surgical intervention.  The patient's history has been reviewed, patient examined, no change in status, stable for surgery.  I have reviewed the patient's chart and labs.  Questions were answered to the patient's satisfaction.     Frisco Cordts

## 2024-01-25 NOTE — Progress Notes (Signed)
  Progress Note  Patient Name: Jill Reilly Date of Encounter: 01/25/2024 [facility] Cardiologist: New  Interval Summary    Blood pressure soft overnight. No further chest pain. No SOB. Plan for LHC today.   Vital Signs Vitals:   01/24/24 2312 01/25/24 0444 01/25/24 0636 01/25/24 0650  BP:   (!) 76/59 (!) 94/59  Pulse: 62 60 60 (!) 57  Resp: 17 20 16 18   Temp:  97.9 F (36.6 C)    TempSrc:      SpO2: 94% 93% 95% 96%  Weight:      Height:       No intake or output data in the 24 hours ending 01/25/24 0755    01/24/2024   11:04 AM 02/08/2023    9:47 AM 08/31/2022    9:10 AM  Last 3 Weights  Weight (lbs) 175 lb 200 lb 201 lb  Weight (kg) 79.379 kg 90.719 kg 91.173 kg      Telemetry/ECG  SB/SR HR 50-60s, rare PVC - Personally Reviewed  Physical Exam  GEN: No acute distress.   Neck: No JVD Cardiac: RRR, no murmurs, rubs, or gallops.  Respiratory: Clear to auscultation bilaterally. GI: Soft, nontender, non-distended  MS: No edema  Relevant CV Studies:   Echo 2019 Study Conclusions   - Left ventricle: The cavity size was normal. Wall thickness was    normal. Systolic function was normal. The estimated ejection    fraction was in the range of 55% to 60%. Wall motion was normal;    there were no regional wall motion abnormalities. Left    ventricular diastolic function parameters were normal.  - Mitral valve: There was mild to moderate regurgitation.  - Atrial septum: A small septal defect cannot be excluded. Doubt    clinical significance given normal size of right sided chambers.   Assessment & Plan   NSTEMI - presented with chest pain found to have elevated troponin stated on IV heparin  - HS trop elevated >2000 - continue ASA, Crestor  20mg  daily, Propranolol  20mg  daily - Recent LDL 46. - echo ordered - Chest CTA with no dissection - continue  IV heparin  - plan for heart cath today.    HTN emergency - BP elevated on admission 166/95 - started  on lisinopril  10mg  daily, but she was hypotensive overnight so we will hold this - propranolol  20mg  daily continued   DM2 - per IM    For questions or updates, please contact [facility] Please consult [email or web address] for contact info under       Signed, Ezekial Arns VEAR Fishman, PA-C

## 2024-01-25 NOTE — Care Management CC44 (Signed)
 Condition Code 44 Documentation Completed  Patient Details  Name: Jill Reilly MRN: [id number] Date of Birth: 01-04-1956   Condition Code 44 given:  Yes Patient signature on Condition Code 44 notice:  Yes Documentation of 2 MD's agreement:  Yes Code 44 added to claim:  Yes    Marinda Cooks, RN 01/25/2024, 5:42 PM

## 2024-01-25 NOTE — Care Management Important Message (Signed)
 Important Message  Patient Details  Name: Jill Reilly MRN: [id number] Date of Birth: 03-25-1956   Important Message Given:   Yes      Marinda Cooks, RN 01/25/2024, 5:42 PM

## 2024-01-25 NOTE — Progress Notes (Signed)
*  PRELIMINARY RESULTS* Echocardiogram 2D Echocardiogram has been performed.  Floydene Harder 01/25/2024, 9:06 AM

## 2024-01-26 ENCOUNTER — Encounter: Payer: Self-pay | Admitting: Cardiovascular Disease

## 2024-01-26 DIAGNOSIS — I2510 Atherosclerotic heart disease of native coronary artery without angina pectoris: Secondary | ICD-10-CM | POA: Diagnosis not present

## 2024-01-26 DIAGNOSIS — I5181 Takotsubo syndrome: Secondary | ICD-10-CM

## 2024-01-26 DIAGNOSIS — I429 Cardiomyopathy, unspecified: Secondary | ICD-10-CM | POA: Diagnosis not present

## 2024-01-26 DIAGNOSIS — I214 Non-ST elevation (NSTEMI) myocardial infarction: Secondary | ICD-10-CM | POA: Diagnosis not present

## 2024-01-26 DIAGNOSIS — I5020 Unspecified systolic (congestive) heart failure: Secondary | ICD-10-CM

## 2024-01-26 LAB — MAGNESIUM: Magnesium: 2.5 mg/dL — ABNORMAL HIGH (ref 1.7–2.4)

## 2024-01-26 LAB — BASIC METABOLIC PANEL WITH GFR
Anion gap: 8 (ref 5–15)
BUN: 15 mg/dL (ref 8–23)
CO2: 25 mmol/L (ref 22–32)
Calcium: 9 mg/dL (ref 8.9–10.3)
Chloride: 105 mmol/L (ref 98–111)
Creatinine, Ser: 0.84 mg/dL (ref 0.44–1.00)
GFR, Estimated: >60 mL/min (ref >60)
Glucose, Bld: 147 mg/dL — ABNORMAL HIGH (ref 70–99)
Potassium: 4.5 mmol/L (ref 3.5–5.1)
Sodium: 138 mmol/L (ref 135–145)

## 2024-01-26 LAB — CBC
HCT: 44.4 % (ref 36.0–46.0)
Hemoglobin: 15.2 g/dL — ABNORMAL HIGH (ref 12.0–15.0)
MCH: 30.5 pg (ref 26.0–34.0)
MCHC: 34.2 g/dL (ref 30.0–36.0)
MCV: 89.2 fL (ref 80.0–100.0)
Platelets: 229 K/uL (ref 150–400)
RBC: 4.98 MIL/uL (ref 3.87–5.11)
RDW: 12.1 % (ref 11.5–15.5)
WBC: 5.4 K/uL (ref 4.0–10.5)
nRBC: 0 % (ref 0.0–0.2)

## 2024-01-26 LAB — PHOSPHORUS: Phosphorus: 3.8 mg/dL (ref 2.5–4.6)

## 2024-01-26 LAB — LIPOPROTEIN A (LPA): Lipoprotein (a): 52.5 nmol/L — ABNORMAL HIGH (ref <75.0)

## 2024-01-26 LAB — GLUCOSE, CAPILLARY: Glucose-Capillary: 72 mg/dL (ref 70–99)

## 2024-01-26 MED ORDER — ASPIRIN 81 MG PO TBEC: 81.0000 mg | DELAYED_RELEASE_TABLET | Freq: Every day | ORAL | 12 refills | Status: AC

## 2024-01-26 MED ORDER — LOSARTAN POTASSIUM 25 MG PO TABS
12.5000 mg | ORAL_TABLET | Freq: Every day | ORAL | 0 refills | Status: DC
Start: 2024-01-26 — End: 2024-03-07

## 2024-01-26 MED ORDER — CLOPIDOGREL BISULFATE 75 MG PO TABS: 75.0000 mg | ORAL_TABLET | Freq: Every day | ORAL | 0 refills | Status: AC

## 2024-01-26 MED ORDER — LOSARTAN POTASSIUM 25 MG PO TABS: 12.5000 mg | ORAL_TABLET | Freq: Every day | ORAL | Status: DC

## 2024-01-26 NOTE — Care Management Obs Status (Signed)
 MEDICARE OBSERVATION STATUS NOTIFICATION   Patient Details  Name: Jill Reilly MRN: [id number] Date of Birth: 07/15/1955   Medicare Observation Status Notification Given:  Yes    Delphine KANDICE Bring, RN 01/26/2024, 12:58 PM

## 2024-01-26 NOTE — Progress Notes (Signed)
  Progress Note  Patient Name: Jill Reilly Date of Encounter: 01/26/2024 [facility] Cardiologist: New  Interval Summary    LHC showed no significant CAD. Echo showed mildly reduced EF. AM labs stable.  She reports migraines this AM. She denies chest pain or SOB. Cath site stable.   Vital Signs Vitals:   01/25/24 2107 01/26/24 0035 01/26/24 0504 01/26/24 0740  BP: 109/69 104/64 134/81 133/76  Pulse: 85 73 90 71  Resp: 15 16 18 18   Temp: 98 F (36.7 C) 98.2 F (36.8 C) 97.7 F (36.5 C) 97.7 F (36.5 C)  TempSrc: Oral Oral    SpO2: 93% 94% 99% 94%  Weight:      Height:       No intake or output data in the 24 hours ending 01/26/24 0840    01/24/2024   11:04 AM 02/08/2023    9:47 AM 08/31/2022    9:10 AM  Last 3 Weights  Weight (lbs) 175 lb 200 lb 201 lb  Weight (kg) 79.379 kg 90.719 kg 91.173 kg      Telemetry/ECG  SR HR 70s, 1st degree AVB - Personally Reviewed  Physical Exam  GEN: No acute distress.   Neck: No JVD Cardiac: RRR, no murmurs, rubs, or gallops.  Respiratory: Clear to auscultation bilaterally. GI: Soft, nontender, non-distended  MS: No edema Relevant CV Studies:   Echo 2019 Study Conclusions   - Left ventricle: The cavity size was normal. Wall thickness was    normal. Systolic function was normal. The estimated ejection    fraction was in the range of 55% to 60%. Wall motion was normal;    there were no regional wall motion abnormalities. Left    ventricular diastolic function parameters were normal.  - Mitral valve: There was mild to moderate regurgitation.  - Atrial septum: A small septal defect cannot be excluded. Doubt    clinical significance given normal size of right sided chambers.   Assessment & Plan   NSTEMI consistent with MINOCA - presented with chest pain found to have elevated troponin stated on IV heparin  - HS trop elevated >2000 - left heart cath showed minimal irregularities with no evidence of obstructive CAD,  low normal LVSF, mildly elevated LVEDP. Stress possibly contributing, plavix  was added. - ASA, plavix , Crestor  20mg  daily - EKG shows 1st degree AVB >313ms. We will stop propranolol  (she was on this for migraines) - Recent LDL 46 - echo showed LVEF 54-49%, G1DD, normal RVSF - possible D/C today, plan for cardiac rehab as OP  HFmrEF Takotsubo CM - Echo showed LVEF 45-50% - stop propranolol  20mg  daily due to worsening PRI - start Losartan  12.5mg  daily - GDMT as able - repeat echo in 2-3 months  HTN emergency - BP elevated on admission 166/95 - hold propranolol  as above - continue Losartan    DM2 - per IM    For questions or updates, please contact [facility] Please consult [email or web address] for contact info under       Signed, Pavle Wiler VEAR Fishman, PA-C

## 2024-01-26 NOTE — Discharge Instructions (Signed)
 Follow up with PCP and pick up medications from pharmacy if applicable. Make sure to take all antibiotics. Do not double up on narcotic/pain medication or drink alcohol during this time. Do not drive while taking narcotic medication. Call 911 or return to ER for life threatening issues or other concerns.

## 2024-01-26 NOTE — Care Management CC44 (Deleted)
 Condition Code 44 Documentation Completed  Patient Details  Name: Jill Reilly MRN: [id number] Date of Birth: April 08, 1956   Condition Code 44 given:  Yes Patient signature on Condition Code 44 notice:  Yes Documentation of 2 MD's agreement:  Yes Code 44 added to claim:  Yes    Delphine KANDICE Bring, RN 01/26/2024, 12:54 PM

## 2024-01-26 NOTE — Care Management Obs Status (Signed)
 MEDICARE OBSERVATION STATUS NOTIFICATION   Patient Details  Name: LAWAN NANEZ MRN: [id number] Date of Birth: [birth year]   Medicare Observation Status Notification Given:   co worker completed code 52 on 01/25/24, signing note to show complete.    [name] 01/26/2024, 10:27 AM

## 2024-01-26 NOTE — Progress Notes (Signed)
 Nsg Discharge Note  Admit Date:  01/24/2024 Discharge date: 01/26/2024   Jill Reilly to be D/C'd Home per MD order.  AVS completed.  Copy for chart, and copy for patient signed, and dated. Patient/caregiver able to verbalize understanding.  IV catheter discontinued intact. Site without signs and symptoms of complications - no redness or edema noted at insertion site, patient denies c/o pain - only slight tenderness at site.  Dressing with slight pressure applied.  D/c Instructions-Education: Discharge instructions given to patient/family with verbalized understanding. D/c education completed with patient/family including follow up instructions, medication list, d/c activities limitations if indicated, with other d/c instructions as indicated by MD - patient able to verbalize understanding, all questions fully answered. Patient instructed to return to ED, call 911, or call MD for any changes in condition.  Patient escorted via WC, and D/C home via private auto.

## 2024-01-26 NOTE — Care Management Obs Status (Deleted)
 MEDICARE OBSERVATION STATUS NOTIFICATION   Patient Details  Name: Jill Reilly MRN: [id number] Date of Birth: March 13, 1956   Medicare Observation Status Notification Given:  Yes    Delphine KANDICE Bring, RN 01/26/2024, 12:54 PM

## 2024-01-26 NOTE — TOC CM/SW Note (Signed)
 Transition of Care [facility]) - Inpatient Brief Assessment   Patient Details  Name: Jill Reilly MRN: [id number] Date of Birth: 1956/02/13  Transition of Care [facility]) CM/SW Contact:    Lauraine JAYSON Carpen, LCSW Phone Number: 01/26/2024, 10:49 AM   Clinical Narrative: Patient has orders to discharge home today. Chart reviewed. No TOC needs identified. CSW signing off.  Transition of Care Asessment: Insurance and Status: Insurance coverage has been reviewed Patient has primary care physician: Yes Home environment has been reviewed: Single family home Prior level of function:: Not documented Prior/Current Home Services: No current home services Social Drivers of Health Review: SDOH reviewed no interventions necessary Readmission risk has been reviewed: Yes Transition of care needs: no transition of care needs at this time

## 2024-01-26 NOTE — Discharge Summary (Signed)
 Physician Discharge Summary   Patient: Jill Reilly MRN: [id number] DOB: Oct 05, 1955  Admit date:     01/24/2024  Discharge date: 01/26/24  Discharge Physician: Landon FORBES Whitten Andreoni   PCP: Lenon Layman ORN, MD   Recommendations at discharge:    Follow up with PCP, Neurology, Cardioogy  Discharge Diagnoses: Principal Problem:   NSTEMI (non-ST elevated myocardial infarction) (HCC) Active Problems:   Mixed hyperlipidemia   Diabetes mellitus type 2 with complications (HCC)   Coronary artery disease involving native coronary artery of native heart with unstable angina pectoris (HCC)  Resolved Problems:   * No resolved hospital problems. [facility] Course: Jill Reilly is a 68 y.o. female with medical history significant of IIDM on Ozempic, HLD, breast cancer status post lumpectomy and radiation, GERD, presented with new onset chest pain.   Patient started to have a pressure-like chest pain yesterday evening, pressure-like, 5-6/10, lasted for few minutes localized, denied any associated symptoms of shortness of breath palpitations sweating and chest pain subsided by its own.  This morning, patient woke up again with severe pressure-like chest pain this time, the chest pain radiated to both shoulders,upper back and left upper arm and associated with sweating.  EMS arrived found patient blood pressure in 200s.   ED Course: Blood pressure remains high at 169/100, chest x-ray negative for acute findings EKG negative for acute ST changes, troponin/2300, BUN 11 creatinine 0 point glucose 98, WBC 7.0 hemoglobin 15.1.   Patient was given nitroglycerin  and heparin  drip started.  Assessment and Plan: # Chest pain # NSTEMI CTA chest ruled out dissection and PE. - s/p heparin  drip, Continue aspirin  - Heart rate borderline bradycardia, will hold off beta-blocker - Risk factors modification, check A1c and lipid panel - TTE LVEF 45 to 50%, LV wall motion abnormality, hypokinesis of anterior and  anteroseptal wall.  Grade 1 DD, negative PFO Cardiology consulted s/p cardiac cath, nonobstructive CAD, no culprit identified.  It could be due to stress at home. Cardiology recommended to monitor overnight and DC today Started Plavix  75 mg p.o. daily for 6 to 12 months.  Continue DAPT Follow-up with cardiology outpatient     # HTN emergency - As needed hydralazine  - Started ACEI-- losatan   # IIDM - Controlled - Outpatient Ozempic therapy      # Migraine headache Patient takes Imitrex which is contraindicated during non-STEMI, continue to hold for 1 week as per cardiology Migraine cocktail including magnesium , Decadron  and Benadryl  x 1 dose given Continue Tylenol  as needed  Hold propranolol , follow up with Neurology for alternate migraine medication     Body mass index is 31 kg/m.  Interventions:        Consultants: Cardiology Procedures performed: Cardiac Cath  Disposition: Home Diet recommendation:  Discharge Diet Orders (From admission, onward)     Start     Ordered   01/26/24 0000  Diet - low sodium heart healthy        01/26/24 1011           Cardiac diet DISCHARGE MEDICATION: Allergies as of 01/26/2024   No Known Allergies      Medication List     STOP taking these medications    albuterol  108 (90 Base) MCG/ACT inhaler Commonly known as: VENTOLIN  HFA   benzonatate  100 MG capsule Commonly known as: TESSALON    cetirizine 10 MG tablet Commonly known as: ZYRTEC   fluticasone 50 MCG/ACT nasal spray Commonly known as: FLONASE   ibuprofen  200 MG  tablet Commonly known as: ADVIL    letrozole  2.5 MG tablet Commonly known as: FEMARA    predniSONE  10 MG (21) Tbpk tablet Commonly known as: STERAPRED UNI-PAK 21 TAB   promethazine -dextromethorphan 6.25-15 MG/5ML syrup Commonly known as: PROMETHAZINE -DM   propranolol  40 MG tablet Commonly known as: INDERAL    ROLAIDS PO   Vitamin B 12 500 MCG Tabs       TAKE these medications     acetaminophen  500 MG tablet Commonly known as: TYLENOL  Take 1,000 mg by mouth every 6 (six) hours as needed for moderate pain or headache.   acidophilus Caps capsule Take by mouth daily.   aspirin  EC 81 MG tablet Take 1 tablet (81 mg total) by mouth daily. Swallow whole. Start taking on: January 27, 2024 What changed: additional instructions   azelastine 0.1 % nasal spray Commonly known as: ASTELIN Place into the nose.   clonazePAM 0.5 MG tablet Commonly known as: KLONOPIN Take by mouth.   clopidogrel  75 MG tablet Commonly known as: PLAVIX  Take 1 tablet (75 mg total) by mouth daily with breakfast. Start taking on: January 27, 2024   losartan  25 MG tablet Commonly known as: COZAAR  Take 0.5 tablets (12.5 mg total) by mouth daily.   rosuvastatin  20 MG tablet Commonly known as: CRESTOR  Take 20 mg by mouth daily.   Semaglutide (2 MG/DOSE) 8 MG/3ML Sopn Inject 2 mg into the skin.  Inject 0.75 mLs (2 mg total) subcutaneously once a week        Discharge Exam: Filed Weights   01/24/24 1104  Weight: 79.4 kg   General: NAD, lying comfortably Appear in no distress, affect appropriate Eyes: PERRLA ENT: Oral Mucosa Clear, moist  Neck: no JVD,  Cardiovascular: S1 and S2 Present, no Murmur,  Respiratory: good respiratory effort, Bilateral Air entry equal and Decreased, no Crackles, no wheezes Abdomen: Bowel Sound present, Soft and no tenderness,  Skin: no rashes Extremities: no Pedal edema, no calf tenderness Neurologic: without any new focal findings  Condition at discharge: good  The results of significant diagnostics from this hospitalization (including imaging, microbiology, ancillary and laboratory) are listed below for reference.   Imaging Studies: ECHOCARDIOGRAM COMPLETE Result Date: 01/25/2024    ECHOCARDIOGRAM REPORT   Patient Name:   Jill Reilly Date of Exam: 01/25/2024 Medical Rec #:  [id number]      Height:       63.0 in Accession #:    [id number]      Weight:       175.0 lb Date of Birth:  08/08/1955       BSA:          1.827 m Patient Age:    68 years       BP:           76/59 mmHg Patient Gender: F              HR:           60 bpm. Exam Location:  [facility] Procedure: 2D Echo, Cardiac Doppler, Color Doppler, Strain Analysis and 3D Echo            (Both Spectral and Color Flow Doppler were utilized during            procedure). Indications:     NSTEMI I21.4  History:         Patient has no prior history of Echocardiogram examinations.  Anxiety,headache.  Sonographer:     Christopher Furnace Referring Phys:  [id number] EVALENE JINNY LUNGER Diagnosing Phys: EVALENE LUNGER MD  Sonographer Comments: Global longitudinal strain was attempted. IMPRESSIONS  1. Left ventricular ejection fraction, by estimation, is 45 to 50%. The left ventricle has mildly decreased function. The left ventricle demonstrates regional wall motion abnormalities (hypokinesis of the anterior and anteroseptal wall). Left ventricular diastolic parameters are consistent with Grade I diastolic dysfunction (impaired relaxation). The average left ventricular global longitudinal strain is -14.4 %. The global longitudinal strain is abnormal.  2. Right ventricular systolic function is normal. The right ventricular size is normal. There is normal pulmonary artery systolic pressure. The estimated right ventricular systolic pressure is 12.1 mmHg.  3. The mitral valve is normal in structure. No evidence of mitral valve regurgitation. No evidence of mitral stenosis.  4. The aortic valve is normal in structure. Aortic valve regurgitation is not visualized. No aortic stenosis is present.  5. The inferior vena cava is normal in size with greater than 50% respiratory variability, suggesting right atrial pressure of 3 mmHg. FINDINGS  Left Ventricle: Left ventricular ejection fraction, by estimation, is 45 to 50%. The left ventricle has mildly decreased function. The left ventricle demonstrates regional wall motion  abnormalities. The average left ventricular global longitudinal strain is -14.4 %. Strain was performed and the global longitudinal strain is abnormal. The left ventricular internal cavity size was normal in size. There is no left ventricular hypertrophy. Left ventricular diastolic parameters are consistent with Grade I diastolic dysfunction (impaired relaxation). Right Ventricle: The right ventricular size is normal. No increase in right ventricular wall thickness. Right ventricular systolic function is normal. There is normal pulmonary artery systolic pressure. The tricuspid regurgitant velocity is 1.33 m/s, and  with an assumed right atrial pressure of 5 mmHg, the estimated right ventricular systolic pressure is 12.1 mmHg. Left Atrium: Left atrial size was normal in size. Right Atrium: Right atrial size was normal in size. Pericardium: There is no evidence of pericardial effusion. Mitral Valve: The mitral valve is normal in structure. No evidence of mitral valve regurgitation. No evidence of mitral valve stenosis. Tricuspid Valve: The tricuspid valve is normal in structure. Tricuspid valve regurgitation is not demonstrated. No evidence of tricuspid stenosis. Aortic Valve: The aortic valve is normal in structure. Aortic valve regurgitation is not visualized. No aortic stenosis is present. Aortic valve mean gradient measures 2.0 mmHg. Aortic valve peak gradient measures 3.4 mmHg. Aortic valve area, by VTI measures 3.58 cm. Pulmonic Valve: The pulmonic valve was normal in structure. Pulmonic valve regurgitation is not visualized. No evidence of pulmonic stenosis. Aorta: The aortic root is normal in size and structure. Venous: The inferior vena cava is normal in size with greater than 50% respiratory variability, suggesting right atrial pressure of 3 mmHg. IAS/Shunts: No atrial level shunt detected by color flow Doppler. Additional Comments: 3D was performed not requiring image post processing on an independent  workstation and was indeterminate.  LEFT VENTRICLE PLAX 2D LVIDd:         4.80 cm   Diastology LVIDs:         3.40 cm   LV e' medial:    4.46 cm/s LV PW:         1.00 cm   LV E/e' medial:  10.0 LV IVS:        1.40 cm   LV e' lateral:   6.85 cm/s LVOT diam:     2.20 cm   LV E/e'  lateral: 6.5 LV SV:         73 LV SV Index:   40        2D Longitudinal Strain LVOT Area:     3.80 cm  2D Strain GLS Avg:     -14.4 %  RIGHT VENTRICLE RV Basal diam:  2.70 cm RV Mid diam:    1.80 cm RV S prime:     10.90 cm/s TAPSE (M-mode): 1.3 cm LEFT ATRIUM           Index        RIGHT ATRIUM          Index LA diam:      2.90 cm 1.59 cm/m   RA Area:     9.38 cm LA Vol (A2C): 15.4 ml 8.43 ml/m   RA Volume:   16.10 ml 8.81 ml/m LA Vol (A4C): 21.3 ml 11.66 ml/m  AORTIC VALVE AV Area (Vmax):    3.48 cm AV Area (Vmean):   3.60 cm AV Area (VTI):     3.58 cm AV Vmax:           91.60 cm/s AV Vmean:          70.800 cm/s AV VTI:            0.205 m AV Peak Grad:      3.4 mmHg AV Mean Grad:      2.0 mmHg LVOT Vmax:         83.80 cm/s LVOT Vmean:        67.100 cm/s LVOT VTI:          0.193 m LVOT/AV VTI ratio: 0.94  AORTA Ao Root diam: 3.10 cm MITRAL VALVE               TRICUSPID VALVE MV Area (PHT): 5.75 cm    TR Peak grad:   7.1 mmHg MV Decel Time: 132 msec    TR Vmax:        133.00 cm/s MV E velocity: 44.50 cm/s MV A velocity: 99.60 cm/s  SHUNTS MV E/A ratio:  0.45        Systemic VTI:  0.19 m                            Systemic Diam: 2.20 cm Evalene Lunger MD Electronically signed by Evalene Lunger MD Signature Date/Time: 01/25/2024/2:56:32 PM    Final    CARDIAC CATHETERIZATION Result Date: 01/25/2024   The left ventricular systolic function is normal.   LV end diastolic pressure is mildly elevated.   The left ventricular ejection fraction is 50-55% by visual estimate. 1.  Minimal irregularities with no evidence of obstructive coronary artery disease. 2.  Low normal LV systolic function with mildly elevated left ventricular end-diastolic  pressure. Recommendations: No clear culprit for is identified for non-STEMI.  She does report significant stress at home which might have contributed.  I added clopidogrel  to be used for 6 to 12 months.   CT ANGIO CHEST AORTA W/CM &/OR WO/CM Result Date: 01/24/2024 CLINICAL DATA:  Chest pain. Diabetes. History of left lumpectomy for breast cancer. EXAM: CT ANGIOGRAPHY CHEST WITH CONTRAST TECHNIQUE: Multidetector CT imaging of the chest was performed using the standard protocol prior to and during bolus administration of intravenous contrast. Multiplanar CT image reconstructions and MIPs were obtained to evaluate the vascular anatomy. RADIATION DOSE REDUCTION: This exam was performed according to the departmental dose-optimization program which includes automated  exposure control, adjustment of the mA and/or kV according to patient size and/or use of iterative reconstruction technique. CONTRAST:  75mL OMNIPAQUE  IOHEXOL  350 MG/ML SOLN COMPARISON:  Chest radiograph 01/24/2024 FINDINGS: Cardiovascular: Initial noncontrast images demonstrate no hyperdensity along the thoracic aortic wall to indicate acute intramural hematoma. There is minimal atheromatous vascular calcification of the aortic arch and left anterior descending coronary artery. No aortic dissection or acute aortic findings. Normal branch vasculature. Patent proximal vertebral arteries. No appreciable filling defect in the pulmonary arterial tree. Mediastinum/Nodes: Unremarkable Lungs/Pleura: Unremarkable Upper Abdomen: Numerous scattered colonic diverticula. Musculoskeletal: Lower cervical and thoracic spondylosis. Review of the MIP images confirms the above findings. IMPRESSION: 1. No acute thoracic aortic findings. 2. Minimal atheromatous vascular calcification of the aortic arch and left anterior descending coronary artery. Aortic Atherosclerosis (ICD10-I70.0). 3. Numerous scattered colonic diverticula. 4. Lower cervical and thoracic spondylosis.  Electronically Signed   By: Ryan Salvage M.D.   On: 01/24/2024 17:33   DG Chest 2 View Result Date: 01/24/2024 CLINICAL DATA:  Chest pain. EXAM: CHEST - 2 VIEW COMPARISON:  February 08, 2023. FINDINGS: The heart size and mediastinal contours are within normal limits. Both lungs are clear. The visualized skeletal structures are unremarkable. IMPRESSION: No active cardiopulmonary disease. Electronically Signed   By: Lynwood Landy Raddle M.D.   On: 01/24/2024 11:43    Microbiology: Results for orders placed or performed during the hospital encounter of 02/08/23  Resp panel by RT-PCR (RSV, Flu A&B, Covid) Anterior Nasal Swab     Status: None   Collection Time: 02/08/23  9:51 AM   Specimen: Anterior Nasal Swab  Result Value Ref Range Status   SARS Coronavirus 2 by RT PCR NEGATIVE NEGATIVE Final    Comment: (NOTE) SARS-CoV-2 target nucleic acids are NOT DETECTED.  The SARS-CoV-2 RNA is generally detectable in upper respiratory specimens during the acute phase of infection. The lowest concentration of SARS-CoV-2 viral copies this assay can detect is 138 copies/mL. A negative result does not preclude SARS-Cov-2 infection and should not be used as the sole basis for treatment or other patient management decisions. A negative result may occur with  improper specimen collection/handling, submission of specimen other than nasopharyngeal swab, presence of viral mutation(s) within the areas targeted by this assay, and inadequate number of viral copies(<138 copies/mL). A negative result must be combined with clinical observations, patient history, and epidemiological information. The expected result is Negative.  Fact Sheet for Patients:  BloggerCourse.com  Fact Sheet for Healthcare Providers:  SeriousBroker.it  This test is no t yet approved or cleared by the [place]  FDA and  has been authorized for detection and/or diagnosis of SARS-CoV-2  by FDA under an Emergency Use Authorization (EUA). This EUA will remain  in effect (meaning this test can be used) for the duration of the COVID-19 declaration under Section 564(b)(1) of the Act, 21 U.S.C.section 360bbb-3(b)(1), unless the authorization is terminated  or revoked sooner.       Influenza A by PCR NEGATIVE NEGATIVE Final   Influenza B by PCR NEGATIVE NEGATIVE Final    Comment: (NOTE) The Xpert Xpress SARS-CoV-2/FLU/RSV plus assay is intended as an aid in the diagnosis of influenza from Nasopharyngeal swab specimens and should not be used as a sole basis for treatment. Nasal washings and aspirates are unacceptable for Xpert Xpress SARS-CoV-2/FLU/RSV testing.  Fact Sheet for Patients: BloggerCourse.com  Fact Sheet for Healthcare Providers: SeriousBroker.it  This test is not yet approved or cleared by the [place]  FDA and  has been authorized for detection and/or diagnosis of SARS-CoV-2 by FDA under an Emergency Use Authorization (EUA). This EUA will remain in effect (meaning this test can be used) for the duration of the COVID-19 declaration under Section 564(b)(1) of the Act, 21 U.S.C. section 360bbb-3(b)(1), unless the authorization is terminated or revoked.     Resp Syncytial Virus by PCR NEGATIVE NEGATIVE Final    Comment: (NOTE) Fact Sheet for Patients: BloggerCourse.com  Fact Sheet for Healthcare Providers: SeriousBroker.it  This test is not yet approved or cleared by the [place]  FDA and has been authorized for detection and/or diagnosis of SARS-CoV-2 by FDA under an Emergency Use Authorization (EUA). This EUA will remain in effect (meaning this test can be used) for the duration of the COVID-19 declaration under Section 564(b)(1) of the Act, 21 U.S.C. section 360bbb-3(b)(1), unless the authorization is terminated or revoked.  Performed at  [facility], [address] [place], [place] [address]     Labs: CBC: Recent Labs  Lab 01/24/24 1106 01/25/24 0854 01/26/24 0452  WBC 7.0 5.3 5.4  HGB 15.1* 14.7 15.2*  HCT 44.4 43.0 44.4  MCV 89.0 90.1 89.2  PLT 246 178 229   Basic Metabolic Panel: Recent Labs  Lab 01/24/24 1106 01/25/24 0854 01/26/24 0452  NA 140  --  138  K 4.3  --  4.5  CL 106  --  105  CO2 24  --  25  GLUCOSE 98  --  147*  BUN 11  --  15  CREATININE 0.80  --  0.84  CALCIUM  9.3  --  9.0  MG  --  2.5* 2.5*  PHOS  --   --  3.8   Liver Function Tests: No results for input(s): AST, ALT, ALKPHOS, BILITOT, PROT, ALBUMIN in the last 168 hours. CBG: Recent Labs  Lab 01/25/24 1116  GLUCAP 72    Discharge time spent: greater than 30 minutes.  Signed: Landon FORBES Baller, MD [facility] 01/26/2024

## 2024-01-26 NOTE — Care Management Obs Status (Signed)
 MEDICARE OBSERVATION STATUS NOTIFICATION   Patient Details  Name: Jill Reilly MRN: [id number] Date of Birth: 11/28/1955   Medicare Observation Status Notification Given:       [name] 01/26/2024, 10:29 AM

## 2024-01-26 NOTE — Care Management CC44 (Deleted)
 Condition Code 44 Documentation Completed  Patient Details  Name: Jill Reilly MRN: [id number] Date of Birth: 03/25/1956   Condition Code 44 given:  Yes Patient signature on Condition Code 44 notice:  Yes Documentation of 2 MD's agreement:  Yes Code 44 added to claim:  Yes    [name] 01/26/2024, 10:24 AM

## 2024-02-08 NOTE — Progress Notes (Unsigned)
 Cardiology Office Note   Date:  02/09/2024  ID:  Jill Reilly, DOB 1955/06/20, MRN [id number] PCP: Lenon Layman ORN, MD  [facility] Providers Cardiologist:  None   History of Present Illness Jill Reilly is a 68 y.o. female with a h/o breast cancer, carotid artery disease, OSA, anxiety, DM2 who presents for hospital follow-up for NSTEMI and stress-induced cardiomyopathy.   The patient was admitted 01/2024 with NSTEMI with troponin >2000. LHC showed no significant CAD. Echo showed mildly reduced LVEF 40-45%, G1DD.  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  The patient was started on ASA and Plavix  for 6 to 12 months.   Today, the patient is overall doing well. She denies chest pain, SOB, or lower leg edema. She just feels tired. Function is slowly improving. She has already called cardiac rehab.  Cath site is stable.    Studies Reviewed EKG Interpretation Date/Time:  Friday February 09 2024 08:46:44 EDT Ventricular Rate:  71 PR Interval:  258 QRS Duration:  92 QT Interval:  412 QTC Calculation: 447 R Axis:   6  Text Interpretation: Sinus rhythm with 1st degree A-V block Cannot rule out Inferior infarct , age undetermined When compared with ECG of 26-Jan-2024 09:49, Minimal criteria for Inferior infarct are now Present Inverted T waves have replaced nonspecific T wave abnormality in Anterior leads Confirmed by Franchester, Eugenia Eldredge ([id number]) on 02/09/2024 8:58:50 AM    LHC 01/2024   The left ventricular systolic function is normal.   LV end diastolic pressure is mildly elevated.   The left ventricular ejection fraction is 50-55% by visual estimate.   1.  Minimal irregularities with no evidence of obstructive coronary artery disease. 2.  Low normal LV systolic function with mildly elevated left ventricular end-diastolic pressure.   Recommendations: No clear culprit for is identified for non-STEMI.  She does report significant stress at home which might have contributed.  I  added clopidogrel  to be used for 6 to 12 months.  Echo 01/2024 1. Left ventricular ejection fraction, by estimation, is 45 to 50%. The  left ventricle has mildly decreased function. The left ventricle  demonstrates regional wall motion abnormalities (hypokinesis of the  anterior and anteroseptal wall). Left  ventricular diastolic parameters are consistent with Grade I diastolic  dysfunction (impaired relaxation). The average left ventricular global  longitudinal strain is -14.4 %. The global longitudinal strain is  abnormal.   2. Right ventricular systolic function is normal. The right ventricular  size is normal. There is normal pulmonary artery systolic pressure. The  estimated right ventricular systolic pressure is 12.1 mmHg.   3. The mitral valve is normal in structure. No evidence of mitral valve  regurgitation. No evidence of mitral stenosis.   4. The aortic valve is normal in structure. Aortic valve regurgitation is  not visualized. No aortic stenosis is present.   5. The inferior vena cava is normal in size with greater than 50%  respiratory variability, suggesting right atrial pressure of 3 mmHg.   Echo 2019 Study Conclusions   - Left ventricle: The cavity size was normal. Wall thickness was    normal. Systolic function was normal. The estimated ejection    fraction was in the range of 55% to 60%. Wall motion was normal;    there were no regional wall motion abnormalities. Left    ventricular diastolic function parameters were normal.  - Mitral valve: There was mild to moderate regurgitation.  - Atrial septum: A small septal defect cannot be excluded. Doubt  clinical significance given normal size of right sided chambers.         Physical Exam VS:  BP 124/82   Pulse 71   Ht 5' 3 (1.6 m)   Wt 174 lb 6.4 oz (79.1 kg)   SpO2 96%   BMI 30.89 kg/m        Wt Readings from Last 3 Encounters:  02/09/24 174 lb 6.4 oz (79.1 kg)  01/24/24 175 lb (79.4 kg)  02/08/23 200  lb (90.7 kg)    GEN: Well nourished, well developed in no acute distress NECK: No JVD; No carotid bruits CARDIAC: RRR, no murmurs, rubs, gallops RESPIRATORY:  Clear to auscultation without rales, wheezing or rhonchi  ABDOMEN: Soft, non-tender, non-distended EXTREMITIES:  No edema; No deformity   ASSESSMENT AND PLAN  NSTEMI consistent with MIN[place]A Recent hospitalization for chest pain found to have elevated troponin to greater than 2000.  Left heart cath showed minimal irregularities with no evidence of obstructive CAD, low normal LVSF, mildly elevated LVEDP.  Echo showed EF of 45 to 50%. Suspected stress induced cardiomyopathy.   She was started on DAPT with aspirin  and Plavix , will continue 6 to 12 months.  Continue Crestor  20 mg daily.  Patient denies any further chest pain.  Cath site is stable.  She has been referred to cardiac rehab. I will order CBC.   HFmrEF Takotsubo cardiomyopathy Echo showed EF of 45 to 50%, suspected stress-induced cardiomyopathy as above.  Patient was started on losartan  12.5 mg daily.  Recent labs showed hyperkalemia, this will continue to be followed by PCP.  I will add on Farxiga  10 mg daily, be met in 2 weeks.  Continue GDMT as able with plan to repeat echo in 2 to 3 months.  Hypertension Blood pressure today is good at 124/82.  Continue losartan  12.5 mg daily.  Hyperkalemia K 5.4. she is on losartan . PCP will re-check in 1 week.     Cardiac Rehabilitation Eligibility Assessment  The patient is ready to start cardiac rehabilitation from a cardiac standpoint.       Dispo: Follow-up in 3 weeks  Signed, Taijah Macrae VEAR Fishman, PA-C

## 2024-02-09 ENCOUNTER — Ambulatory Visit: Admitting: Medical

## 2024-02-09 ENCOUNTER — Encounter: Payer: Self-pay | Admitting: Medical

## 2024-02-09 ENCOUNTER — Ambulatory Visit: Attending: Medical | Admitting: Medical

## 2024-02-09 VITALS — BP 124/82 | HR 71 | Ht 63.0 in | Wt 174.4 lb

## 2024-02-09 DIAGNOSIS — I214 Non-ST elevation (NSTEMI) myocardial infarction: Secondary | ICD-10-CM | POA: Diagnosis not present

## 2024-02-09 DIAGNOSIS — E875 Hyperkalemia: Secondary | ICD-10-CM

## 2024-02-09 DIAGNOSIS — I5022 Chronic systolic (congestive) heart failure: Secondary | ICD-10-CM | POA: Diagnosis not present

## 2024-02-09 DIAGNOSIS — I428 Other cardiomyopathies: Secondary | ICD-10-CM

## 2024-02-09 DIAGNOSIS — I5181 Takotsubo syndrome: Secondary | ICD-10-CM | POA: Diagnosis not present

## 2024-02-09 DIAGNOSIS — I10 Essential (primary) hypertension: Secondary | ICD-10-CM

## 2024-02-09 MED ORDER — DAPAGLIFLOZIN PROPANEDIOL 10 MG PO TABS: 10.0000 mg | ORAL_TABLET | Freq: Every day | ORAL | 3 refills | Status: DC

## 2024-02-09 NOTE — Patient Instructions (Signed)
 Medication Instructions:  - START farxiga  10 mg daily   *If you need a refill on your cardiac medications before your next appointment, please call your pharmacy*  Lab Work: Your provider would like for you to have following labs drawn today CBC.  Your provider would like for you to return in 2 weeks to have the following labs drawn: BMP.  Please go to [facility] [address] ([facility] [id number], [place] [address] You do not need an appointment.  They are open from 8 am- 4:30 pm.  Lunch from 1:00 pm- 2:00 pm You do not need to be fasting.  If you have labs (blood work) drawn today and your tests are completely normal, you will receive your results only by: MyChart Message (if you have MyChart) OR A paper copy in the mail If you have any lab test that is abnormal or we need to change your treatment, we will call you to review the results.  Testing/Procedures: No test ordered today   Follow-Up: At [facility], you and your health needs are our priority.  As part of our continuing mission to provide you with exceptional heart care, our providers are all part of one team.  This team includes your primary Cardiologist (physician) and Advanced Practice Providers or APPs (Physician Assistants and Nurse Practitioners) who all work together to provide you with the care you need, when you need it.  Your next appointment:   3 week(s)  Provider:   You may see Cadence Franchester, PA-C or one of the following Advanced Practice Providers on your designated Care Team:   Lonni Meager, NP Lesley Maffucci, PA-C Bernardino Bring, PA-C Cadence Ainaloa, PA-C Tylene Lunch, NP Barnie Hila, NP    We recommend signing up for the patient portal called MyChart.  Sign up information is provided on this After Visit Summary.  MyChart is used to connect with patients for Virtual Visits (Telemedicine).  Patients are able to view lab/test results, encounter notes, upcoming  appointments, etc.  Non-urgent messages can be sent to your provider as well.   To learn more about what you can do with MyChart, go to [email or web address].

## 2024-02-20 ENCOUNTER — Ambulatory Visit
Admission: EM | Admit: 2024-02-20 | Discharge: 2024-02-20 | Disposition: A | Discharge status: Home / Self Care | Attending: Emergency Medicine | Admitting: Emergency Medicine

## 2024-02-20 ENCOUNTER — Ambulatory Visit (INDEPENDENT_AMBULATORY_CARE_PROVIDER_SITE_OTHER)

## 2024-02-20 DIAGNOSIS — B338 Other specified viral diseases: Secondary | ICD-10-CM

## 2024-02-20 DIAGNOSIS — J019 Acute sinusitis, unspecified: Secondary | ICD-10-CM | POA: Insufficient documentation

## 2024-02-20 DIAGNOSIS — R0789 Other chest pain: Secondary | ICD-10-CM | POA: Diagnosis present

## 2024-02-20 DIAGNOSIS — B974 Respiratory syncytial virus as the cause of diseases classified elsewhere: Secondary | ICD-10-CM | POA: Diagnosis not present

## 2024-02-20 DIAGNOSIS — R0602 Shortness of breath: Secondary | ICD-10-CM | POA: Diagnosis not present

## 2024-02-20 LAB — RESP PANEL BY RT-PCR (RSV, FLU A&B, COVID)  RVPGX2
Influenza A by PCR: NEGATIVE
Influenza B by PCR: NEGATIVE
Resp Syncytial Virus by PCR: POSITIVE — AB
SARS Coronavirus 2 by RT PCR: NEGATIVE

## 2024-02-20 MED ORDER — AMOXICILLIN-POT CLAVULANATE 875-125 MG PO TABS: 1.0000 | ORAL_TABLET | Freq: Two times a day (BID) | ORAL | 0 refills | Status: DC

## 2024-02-20 MED ORDER — ALBUTEROL SULFATE HFA 108 (90 BASE) MCG/ACT IN AERS: 1.0000 | INHALATION_SPRAY | RESPIRATORY_TRACT | 0 refills | Status: DC | PRN

## 2024-02-20 MED ORDER — PROMETHAZINE-DM 6.25-15 MG/5ML PO SYRP: 5.0000 mL | ORAL_SOLUTION | Freq: Four times a day (QID) | ORAL | 0 refills | Status: AC | PRN

## 2024-02-20 MED ORDER — AEROCHAMBER MV MISC: 1 refills | Status: DC

## 2024-02-20 MED ORDER — FLUTICASONE PROPIONATE 50 MCG/ACT NA SUSP: 2.0000 | Freq: Every day | NASAL | 0 refills | Status: AC

## 2024-02-20 NOTE — ED Provider Notes (Signed)
 HPI  SUBJECTIVE:  Jill Reilly is a 68 y.o. female who presents with 4 days of nasal congestion, light yellow rhinorrhea, a cough productive of whitish-yellow phlegm, sternal chest pain with coughing, sinus pain and pressure, constant shortness of breath described as something sitting on my chest, dyspnea on exertion, nocturia.  States that she has had similar shortness of breath with previous URIs.  States that the pain she had with the NSTEMI was completely different.  She is unable to sleep at night because of coughing.  She denies fevers, facial swelling, upper dental pain, wheezing, chest pain, lower extremity edema, unintentional weight gain, orthopnea, abdominal pain.  Grandchild is currently sick with a viral illness.  No antibiotics in the past 3 months.  No antipyretic in the past 6 hours.  She has been taking Mucinex, DayQuil, Tylenol  without improvement in her symptoms.  No aggravating factors  She has a past medical history of breast cancer, GERD, migraines, NSTEMI on 01/24/2024 with no stent placement, cardiac catheterization 01/25/2024, diabetes, hyperlipidemia.  Patient states the NSTEMI was attributed to stress, but her cardiac catheterization was clean.  No history of hypertension, CHF, pulmonary disease.  [facility]  Past Medical History:  Diagnosis Date   Anxiety    Arthritis    FINGERS   Cancer (HCC) 03/2016   left breast   GERD (gastroesophageal reflux disease)    Headache    H/O MIGRAINES   Personal history of radiation therapy 2017/2018   left breast ca    Past Surgical History:  Procedure Laterality Date   BILATERAL CARPAL TUNNEL RELEASE     BREAST BIOPSY Left 03/31/2016   invasive mammary   BREAST LUMPECTOMY Left 04/29/2016   invasive mammary carcinoma. Clear margins   CATARACT EXTRACTION W/PHACO Left 08/10/2022   Procedure: CATARACT EXTRACTION PHACO AND INTRAOCULAR LENS PLACEMENT (IOC) LEFT  CLAREON TORIC LENS  6.33  00:54.6;  Surgeon:  Mittie Gaskin, MD;  Location: [facility];  Service: Ophthalmology;  Laterality: Left;   CATARACT EXTRACTION W/PHACO Right 08/31/2022   Procedure: CATARACT EXTRACTION PHACO AND INTRAOCULAR LENS PLACEMENT (IOC) RIGHT CLAREON TORIC 7.78 00:51.5;  Surgeon: Mittie Gaskin, MD;  Location: [facility];  Service: Ophthalmology;  Laterality: Right;   CESAREAN SECTION     CHOLECYSTECTOMY     LEFT HEART CATH AND CORONARY ANGIOGRAPHY N/A 01/25/2024   Procedure: LEFT HEART CATH AND CORONARY ANGIOGRAPHY;  Surgeon: Darron Deatrice LABOR, MD;  Location: [facility];  Service: Cardiovascular;  Laterality: N/A;   PARTIAL MASTECTOMY WITH NEEDLE LOCALIZATION Left 04/29/2016   Procedure: PARTIAL MASTECTOMY WITH NEEDLE LOCALIZATION;  Surgeon: Larinda Unknown Sharps, MD;  Location: [facility];  Service: General;  Laterality: Left;   SENTINEL NODE BIOPSY Left 04/29/2016   Procedure: SENTINEL NODE BIOPSY;  Surgeon: Larinda Unknown Sharps, MD;  Location: [facility];  Service: General;  Laterality: Left;    Family History  Problem Relation Age of Onset   Breast cancer Neg Hx     Social History   Tobacco Use   Smoking status: Never   Smokeless tobacco: Never  Vaping Use   Vaping status: Never Used  Substance Use Topics   Alcohol use: Yes    Comment: occasional   Drug use: No    No current facility-administered medications for this encounter.  Current Outpatient Medications:    acidophilus (RISAQUAD) CAPS capsule, Take by mouth daily., Disp: , Rfl:    albuterol  (VENTOLIN  HFA) 108 (90 Base) MCG/ACT inhaler, Inhale 1-2  puffs into the lungs every 4 (four) hours as needed for wheezing or shortness of breath., Disp: 1 each, Rfl: 0   amoxicillin-clavulanate (AUGMENTIN) 875-125 MG tablet, Take 1 tablet by mouth every 12 (twelve) hours., Disp: 14 tablet, Rfl: 0   aspirin  EC 81 MG tablet, Take 1 tablet (81 mg total) by mouth daily. Swallow whole., Disp: 30 tablet, Rfl: 12   clopidogrel  (PLAVIX )  75 MG tablet, Take 1 tablet (75 mg total) by mouth daily with breakfast., Disp: 30 tablet, Rfl: 0   fluticasone (FLONASE) 50 MCG/ACT nasal spray, Place 2 sprays into both nostrils daily., Disp: 16 g, Rfl: 0   promethazine -dextromethorphan (PROMETHAZINE -DM) 6.25-15 MG/5ML syrup, Take 5 mLs by mouth 4 (four) times daily as needed for cough., Disp: 118 mL, Rfl: 0   rosuvastatin  (CRESTOR ) 20 MG tablet, Take 20 mg by mouth daily., Disp: , Rfl:    Semaglutide, 2 MG/DOSE, 8 MG/3ML SOPN, Inject 2 mg into the skin.  Inject 0.75 mLs (2 mg total) subcutaneously once a week, Disp: , Rfl:    Spacer/Aero-Holding Chambers (AEROCHAMBER MV) inhaler, Use as instructed, Disp: 1 each, Rfl: 1   acetaminophen  (TYLENOL ) 500 MG tablet, Take 1,000 mg by mouth every 6 (six) hours as needed for moderate pain or headache., Disp: , Rfl:    azelastine (ASTELIN) 0.1 % nasal spray, Place into the nose., Disp: , Rfl:    clonazePAM (KLONOPIN) 0.5 MG tablet, Take by mouth. (Patient taking differently: Take by mouth as needed.), Disp: , Rfl:    dapagliflozin  propanediol (FARXIGA ) 10 MG TABS tablet, Take 1 tablet (10 mg total) by mouth daily before breakfast., Disp: 30 tablet, Rfl: 3   losartan  (COZAAR ) 25 MG tablet, Take 0.5 tablets (12.5 mg total) by mouth daily., Disp: 30 tablet, Rfl: 0  No Known Allergies   ROS  As noted in HPI.   Physical Exam  BP 126/88 (BP Location: Left Arm)   Pulse 67   Temp 98.4 F (36.9 C) (Oral)   Wt 78.1 kg   SpO2 99%   BMI 30.50 kg/m   Constitutional: Well developed, well nourished, no acute distress Eyes: PERRL, EOMI, conjunctiva normal bilaterally HENT: Normocephalic, atraumatic,mucus membranes moist.  Has extensive nasal congestion.  Erythematous, swollen turbinates.  No maxillary, frontal sinus tenderness.  Normal oropharynx.  No obvious postnasal drip. Respiratory: Clear to auscultation bilaterally, no rales, no wheezing, no rhonchi.  Positive reproducible chest wall tenderness along  the sternum. Cardiovascular: Normal rate and rhythm, no murmurs, no gallops, no rubs GI: nondistended skin: No rash, skin intact Musculoskeletal: no lower extremity edema Neurologic: Alert & oriented x 3, CN III-XII grossly intact, no motor deficits, sensation grossly intact Psychiatric: Speech and behavior appropriate   ED Course   Medications - No data to display  Orders Placed This Encounter  Procedures   Resp panel by RT-PCR (RSV, Flu A&B, Covid) Anterior Nasal Swab    Standing Status:   Standing    Number of Occurrences:   1   DG Chest 2 View    Standing Status:   Standing    Number of Occurrences:   1    Reason for Exam (SYMPTOM  OR DIAGNOSIS REQUIRED):   RSV positive-CP SOB r/o pulmonary edema, pleural effusion, pneumonia, acute cardiopulmonary disease   Airborne and Contact precautions    Standing Status:   Standing    Number of Occurrences:   1   ED EKG    Standing Status:   Standing    Number  of Occurrences:   1    Reason for Exam:   Shortness of breath   EKG 12-Lead    Standing Status:   Standing    Number of Occurrences:   1   Results for orders placed or performed during the hospital encounter of 02/20/24 (from the past 24 hours)  Resp panel by RT-PCR (RSV, Flu A&B, Covid) Anterior Nasal Swab     Status: Abnormal   Collection Time: 02/20/24 11:10 AM   Specimen: Anterior Nasal Swab  Result Value Ref Range   SARS Coronavirus 2 by RT PCR NEGATIVE NEGATIVE   Influenza A by PCR NEGATIVE NEGATIVE   Influenza B by PCR NEGATIVE NEGATIVE   Resp Syncytial Virus by PCR POSITIVE (A) NEGATIVE   DG Chest 2 View Result Date: 02/20/2024 CLINICAL DATA:  Shortness of breath EXAM: CHEST - 2 VIEW COMPARISON:  Chest x-ray performed January 24, 2024 FINDINGS: The heart size and mediastinal contours are within normal limits. Both lungs are clear. The visualized skeletal structures are unremarkable. IMPRESSION: No active cardiopulmonary disease.  No focal infiltrate. Electronically  Signed   By: Maude Naegeli M.D.   On: 02/20/2024 12:34    ED Clinical Impression  1. Infection due to respiratory syncytial virus (RSV), unspecified infection type   2. Acute sinusitis due to respiratory syncytial virus (RSV)      ED Assessment/Plan    COVID, flu negative.  Positive RSV.  Discussed with patient and family member while in the department  Check chest x-ray, but also EKG due to recent NSTEMI/cardiac catheterization.  Calculated creatinine clearance based on labs done earlier this month 77 mL/min  Reviewed imaging independently.  No acute cardiopulmonary disease.  No change compared to EKG done on January 24, 2024.  Formal radiology overread pending.  Will contact patient at [phone] if radiology overread differs in the from mine and we need to change management.  Discussed this with patient and family member  Reviewed radiology report.  No acute cardiopulmonary disease consistent with my read.  See radiology report for full details.  EKG: Sinus arrhythmia with first-degree AV block.  Rate 80.  Normal axis, no hypertrophy.  No ST-T wave changes.  First-degree AV block present previous EKG from 9/19.  Home with Flonase, saline nasal irrigation, continue Mucinex, Promethazine  DM, 2 puffs from an albuterol  inhaler with spacer every 4-6 hours as needed for coughing, wheezing, shortness of breath.  Will send home with a wait-and-see prescription of Augmentin in case she develops a secondary sinusitis.  Went over indications for starting this.  Discussed labs, imaging, MDM, treatment plan, and plan for follow-up with patient and family member.  Discussed sn/sx that should prompt return to the ED. they agree with plan.   Meds ordered this encounter  Medications   fluticasone (FLONASE) 50 MCG/ACT nasal spray    Sig: Place 2 sprays into both nostrils daily.    Dispense:  16 g    Refill:  0   amoxicillin-clavulanate (AUGMENTIN) 875-125 MG tablet    Sig: Take 1 tablet by  mouth every 12 (twelve) hours.    Dispense:  14 tablet    Refill:  0   promethazine -dextromethorphan (PROMETHAZINE -DM) 6.25-15 MG/5ML syrup    Sig: Take 5 mLs by mouth 4 (four) times daily as needed for cough.    Dispense:  118 mL    Refill:  0   albuterol  (VENTOLIN  HFA) 108 (90 Base) MCG/ACT inhaler    Sig: Inhale 1-2 puffs into the lungs  every 4 (four) hours as needed for wheezing or shortness of breath.    Dispense:  1 each    Refill:  0   Spacer/Aero-Holding Chambers (AEROCHAMBER MV) inhaler    Sig: Use as instructed    Dispense:  1 each    Refill:  1      *This clinic note was created using Dragon dictation software. Therefore, there may be occasional mistakes despite careful proofreading. ?    Van Knee, MD 02/20/24 [phone]

## 2024-02-20 NOTE — Discharge Instructions (Addendum)
 Your RSV is positive.  COVID and flu are negative.  EKG is reassuring.  Radiology read your x-ray as negative for pneumonia or any other acute process.  Continue Mucinex.  Flonase, saline nasal irrigation with a [employer]Med sinus rinse and distilled water  as often as you want, 2 puffs from an albuterol  inhaler with a spacer every 4-6 hours as needed for coughing, wheezing, shortness of breath, promethazine  DM for the cough to help you sleep at night.  Wait-and-see prescription of Augmentin for secondary sinusitis

## 2024-02-20 NOTE — ED Triage Notes (Signed)
 Pt c/o cough, congestion x4days  Pt states that she is having a lot of phlegm and it tastes metallic   Pt states that she lives with her grandchild who is constantly sick

## 2024-02-23 ENCOUNTER — Other Ambulatory Visit: Payer: Self-pay | Admitting: Emergency Medicine

## 2024-02-23 DIAGNOSIS — I214 Non-ST elevation (NSTEMI) myocardial infarction: Secondary | ICD-10-CM

## 2024-02-24 LAB — BASIC METABOLIC PANEL WITH GFR
BUN/Creatinine Ratio: 15 (ref 12–28)
BUN: 14 mg/dL (ref 8–27)
CO2: 21 mmol/L (ref 20–29)
Calcium: 9.3 mg/dL (ref 8.7–10.3)
Chloride: 101 mmol/L (ref 96–106)
Creatinine, Ser: 0.94 mg/dL (ref 0.57–1.00)
Glucose: 83 mg/dL (ref 70–99)
Potassium: 4.7 mmol/L (ref 3.5–5.2)
Sodium: 141 mmol/L (ref 134–144)
eGFR: 65 mL/min/1.73 (ref >59)

## 2024-02-24 LAB — CBC
Hematocrit: 46.3 % (ref 34.0–46.6)
Hemoglobin: 14.8 g/dL (ref 11.1–15.9)
MCH: 30.1 pg (ref 26.6–33.0)
MCHC: 32 g/dL (ref 31.5–35.7)
MCV: 94 fL (ref 79–97)
Platelets: 242 x10E3/uL (ref 150–450)
RBC: 4.92 x10E6/uL (ref 3.77–5.28)
RDW: 12.6 % (ref 11.7–15.4)
WBC: 6.1 x10E3/uL (ref 3.4–10.8)

## 2024-02-26 ENCOUNTER — Ambulatory Visit: Payer: Self-pay | Admitting: Medical

## 2024-03-06 NOTE — Progress Notes (Unsigned)
 Cardiology Office Note   Date:  03/07/2024  ID:  Jill Reilly, DOB July 08, 1955, MRN [id number] PCP: Jill Reilly ORN, MD  [facility] Providers Cardiologist:  None   History of Present Illness Jill Reilly is a 68 y.o. female  with a h/o breast cancer, carotid artery disease, OSA, anxiety, DM2, stress induced CM, nonobstructive CAD who presents for follow-up of NSTEMI and stress-induced cardiomyopathy.    The patient was admitted 01/2024 with NSTEMI with troponin >2000. LHC showed no significant CAD. Echo showed mildly reduced LVEF 40-45%, G1DD.  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  The patient was started on ASA and Plavix  for 6 to 12 months.   Thre patient was last seen 02/09/24 and was stable from a cardiac perspective.  She was on losartan  and Farxiga .  Today, the patient reports she has been feeling overall okay.  She denies chest pain or shortness of breath.  Losartan  was stopped by PCP, suspected this was from hyperkalemia. She feels dizzy sometimes in the morning after mediations, she has not checked her blood pressure at this time.  I recommended she take medications with food. She starts back rehab Tuesday.   Studies Reviewed      [facility] 01/2024   The left ventricular systolic function is normal.   LV end diastolic pressure is mildly elevated.   The left ventricular ejection fraction is 50-55% by visual estimate.   1.  Minimal irregularities with no evidence of obstructive coronary artery disease. 2.  Low normal LV systolic function with mildly elevated left ventricular end-diastolic pressure.   Recommendations: No clear culprit for is identified for non-STEMI.  She does report significant stress at home which might have contributed.  I added clopidogrel  to be used for 6 to 12 months.   Echo 01/2024 1. Left ventricular ejection fraction, by estimation, is 45 to 50%. The  left ventricle has mildly decreased function. The left ventricle  demonstrates regional  wall motion abnormalities (hypokinesis of the  anterior and anteroseptal wall). Left  ventricular diastolic parameters are consistent with Grade I diastolic  dysfunction (impaired relaxation). The average left ventricular global  longitudinal strain is -14.4 %. The global longitudinal strain is  abnormal.   2. Right ventricular systolic function is normal. The right ventricular  size is normal. There is normal pulmonary artery systolic pressure. The  estimated right ventricular systolic pressure is 12.1 mmHg.   3. The mitral valve is normal in structure. No evidence of mitral valve  regurgitation. No evidence of mitral stenosis.   4. The aortic valve is normal in structure. Aortic valve regurgitation is  not visualized. No aortic stenosis is present.   5. The inferior vena cava is normal in size with greater than 50%  respiratory variability, suggesting right atrial pressure of 3 mmHg.    Echo 2019 Study Conclusions   - Left ventricle: The cavity size was normal. Wall thickness was    normal. Systolic function was normal. The estimated ejection    fraction was in the range of 55% to 60%. Wall motion was normal;    there were no regional wall motion abnormalities. Left    ventricular diastolic function parameters were normal.  - Mitral valve: There was mild to moderate regurgitation.  - Atrial septum: A small septal defect cannot be excluded. Doubt    clinical significance given normal size of right sided chambers.           Physical Exam VS:  BP 120/80 (BP Location: Left Arm,  Patient Position: Sitting, Cuff Size: Normal)   Pulse 80   Ht 5' 3 (1.6 m)   Wt 169 lb 2 oz (76.7 kg)   SpO2 96%   BMI 29.96 kg/m        Wt Readings from Last 3 Encounters:  03/07/24 169 lb 2 oz (76.7 kg)  02/20/24 172 lb 3.2 oz (78.1 kg)  02/09/24 174 lb 6.4 oz (79.1 kg)    GEN: Well nourished, well developed in no acute distress NECK: No JVD; No carotid bruits CARDIAC: RRR, no murmurs, rubs,  gallops RESPIRATORY:  Clear to auscultation without rales, wheezing or rhonchi  ABDOMEN: Soft, non-tender, non-distended EXTREMITIES:  No edema; No deformity   ASSESSMENT AND PLAN  NSTEMI with MINOCA Patient was hospitalized in September 2025 with chest pain found to have elevated troponin greater than 2000.  Left heart cath showed minimal irregularities with no evidence of obstructive CAD, low normal LVSF, mildly elevated LVEDP.  Echo showed EF of 45 to 50%, suspected stress-induced cardiomyopathy.  She was started on DAPT with aspirin  and Plavix , plan to continue 6 to 12 months.  Continue Crestor  20 mg daily.  Patient denies any further chest pain or shortness of breath.  She will start cardiac rehab next week.  HFmrEF Takotsubo CM Echo showed LVEF 45-50%. Cardiac cath showed no CAD. 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  Patient was started on losartan  and Farxiga .  Losartan  recently stopped due to?  Hyperkalemia.  Repeat blood work showed normalized potassium levels.  We will continue Farxiga  10 mg daily.  I will add on Toprol 12.5 mg daily.  Suspect she would not tolerate spironolactone due to hyperkalemia. Repeat limited echo next month.  HTN Blood pressure is good today.  Continue Farxiga .  Start Toprol as above.  Hyperkalemia Losartan  stopped with improvement of K. Would not likely tolerate spiro.      Cardiac Rehabilitation Eligibility Assessment  The patient is ready to start cardiac rehabilitation from a cardiac standpoint.       Dispo: Follow-up in 3 months  Signed, Jill Louischarles VEAR Fishman, PA-C

## 2024-03-07 ENCOUNTER — Encounter: Payer: Self-pay | Admitting: Medical

## 2024-03-07 ENCOUNTER — Ambulatory Visit: Attending: Medical | Admitting: Medical

## 2024-03-07 VITALS — BP 120/80 | HR 80 | Ht 63.0 in | Wt 169.1 lb

## 2024-03-07 DIAGNOSIS — I214 Non-ST elevation (NSTEMI) myocardial infarction: Secondary | ICD-10-CM | POA: Diagnosis not present

## 2024-03-07 DIAGNOSIS — E875 Hyperkalemia: Secondary | ICD-10-CM

## 2024-03-07 DIAGNOSIS — I5022 Chronic systolic (congestive) heart failure: Secondary | ICD-10-CM

## 2024-03-07 DIAGNOSIS — I5181 Takotsubo syndrome: Secondary | ICD-10-CM | POA: Diagnosis not present

## 2024-03-07 DIAGNOSIS — I10 Essential (primary) hypertension: Secondary | ICD-10-CM

## 2024-03-07 MED ORDER — METOPROLOL SUCCINATE ER 25 MG PO TB24: 12.5000 mg | ORAL_TABLET | Freq: Every day | ORAL | 3 refills | Status: AC

## 2024-03-07 NOTE — Patient Instructions (Signed)
 Medication Instructions:  Your physician recommends the following medication changes.  START TAKING: Metoprolol 12.5 mg by mouth daily  *If you need a refill on your cardiac medications before your next appointment, please call your pharmacy*  Lab Work: No labs ordered today    Testing/Procedures: Your physician has requested that you have an Limited echocardiogram in November. Echocardiography is a painless test that uses sound waves to create images of your heart. It provides your doctor with information about the size and shape of your heart and how well your heart's chambers and valves are working.   You may receive an ultrasound enhancing agent through an IV if needed to better visualize your heart during the echo. This procedure takes approximately one hour.  There are no restrictions for this procedure.  This will take place at [address] [facility]) [address], [place] [address]  Please note: We ask at that you not bring children with you during ultrasound (echo/ vascular) testing. Due to room size and safety concerns, children are not allowed in the ultrasound rooms during exams. Our front office staff cannot provide observation of children in our lobby area while testing is being conducted. An adult accompanying a patient to their appointment will only be allowed in the ultrasound room at the discretion of the ultrasound technician under special circumstances. We apologize for any inconvenience.   Follow-Up: At [facility], you and your health needs are our priority.  As part of our continuing mission to provide you with exceptional heart care, our providers are all part of one team.  This team includes your primary Cardiologist (physician) and Advanced Practice Providers or APPs (Physician Assistants and Nurse Practitioners) who all work together to provide you with the care you need, when you need it.  Your next appointment:   3 month(s)  Provider:    Mikey Fishman, PA-C

## 2024-03-25 ENCOUNTER — Telehealth: Payer: Self-pay | Admitting: Medical

## 2024-03-25 NOTE — Telephone Encounter (Signed)
 Error

## 2024-04-23 ENCOUNTER — Ambulatory Visit: Attending: Medical

## 2024-04-23 ENCOUNTER — Ambulatory Visit: Payer: Self-pay | Admitting: Medical

## 2024-04-23 DIAGNOSIS — I5022 Chronic systolic (congestive) heart failure: Secondary | ICD-10-CM | POA: Diagnosis not present

## 2024-04-23 LAB — ECHOCARDIOGRAM LIMITED
AR max vel: 2.44 cm2
AV Area VTI: 2.4 cm2
AV Area mean vel: 2.3 cm2
AV Mean grad: 3 mmHg
AV Peak grad: 5.2 mmHg
Ao pk vel: 1.14 m/s
Area-P 1/2: 3.21 cm2
S' Lateral: 3.3 cm

## 2024-06-01 ENCOUNTER — Other Ambulatory Visit: Payer: Self-pay | Admitting: Medical

## 2024-06-11 ENCOUNTER — Ambulatory Visit: Attending: Medical | Admitting: Medical

## 2024-06-11 ENCOUNTER — Encounter: Payer: Self-pay | Admitting: Medical

## 2024-06-11 VITALS — BP 120/82 | HR 68 | Ht 63.0 in | Wt 164.0 lb

## 2024-06-11 DIAGNOSIS — E875 Hyperkalemia: Secondary | ICD-10-CM

## 2024-06-11 DIAGNOSIS — E782 Mixed hyperlipidemia: Secondary | ICD-10-CM

## 2024-06-11 DIAGNOSIS — I214 Non-ST elevation (NSTEMI) myocardial infarction: Secondary | ICD-10-CM | POA: Diagnosis not present

## 2024-06-11 DIAGNOSIS — I10 Essential (primary) hypertension: Secondary | ICD-10-CM | POA: Diagnosis not present

## 2024-06-11 DIAGNOSIS — I5181 Takotsubo syndrome: Secondary | ICD-10-CM | POA: Diagnosis not present

## 2024-06-11 DIAGNOSIS — I428 Other cardiomyopathies: Secondary | ICD-10-CM

## 2024-06-11 NOTE — Progress Notes (Signed)
 " Cardiology Office Note   Date:  06/11/2024  ID:  Jill Reilly, DOB 10-Feb-1956, MRN [id number] PCP: Lenon Layman ORN, MD  [facility] Providers Cardiologist:  None   History of Present Illness Jill Reilly is a 69 y.o. female with a h/o breast cancer, carotid artery disease, OSA, anxiety, DM2, stress induced CM, nonobstructive CAD who presents for follow-up of NSTEMI and stress-induced cardiomyopathy.    The patient was admitted 01/2024 with NSTEMI with troponin >2000. LHC showed no significant CAD. Echo showed mildly reduced LVEF 40-45%, G1DD.  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  The patient was started on ASA and Plavix  for 6 to 12 months.    Patient was last seen 03/07/2024 and was overall feeling well.  Losartan  was stopped by PCP, suspected from hypokalemia.  Repeat limited echo was ordered.  This showed LVEF 55 to 60%, grade 1 diastolic dysfunction.  Today, the patient is overall doing well. She just finished cardiac rehab Friday. She joined [employer] and plans to go [employer]. She deneis cehst pain, SOB, lower leg edema, lightheadedness, dizziness, palpitations.   Studies Reviewed      [facility] 01/2024   The left ventricular systolic function is normal.   LV end diastolic pressure is mildly elevated.   The left ventricular ejection fraction is 50-55% by visual estimate.   1.  Minimal irregularities with no evidence of obstructive coronary artery disease. 2.  Low normal LV systolic function with mildly elevated left ventricular end-diastolic pressure.   Recommendations: No clear culprit for is identified for non-STEMI.  She does report significant stress at home which might have contributed.  I added clopidogrel  to be used for 6 to 12 months.   Echo 01/2024 1. Left ventricular ejection fraction, by estimation, is 45 to 50%. The  left ventricle has mildly decreased function. The left ventricle  demonstrates regional wall motion abnormalities (hypokinesis of the   anterior and anteroseptal wall). Left  ventricular diastolic parameters are consistent with Grade I diastolic  dysfunction (impaired relaxation). The average left ventricular global  longitudinal strain is -14.4 %. The global longitudinal strain is  abnormal.   2. Right ventricular systolic function is normal. The right ventricular  size is normal. There is normal pulmonary artery systolic pressure. The  estimated right ventricular systolic pressure is 12.1 mmHg.   3. The mitral valve is normal in structure. No evidence of mitral valve  regurgitation. No evidence of mitral stenosis.   4. The aortic valve is normal in structure. Aortic valve regurgitation is  not visualized. No aortic stenosis is present.   5. The inferior vena cava is normal in size with greater than 50%  respiratory variability, suggesting right atrial pressure of 3 mmHg.    Echo 2019 Study Conclusions   - Left ventricle: The cavity size was normal. Wall thickness was    normal. Systolic function was normal. The estimated ejection    fraction was in the range of 55% to 60%. Wall motion was normal;    there were no regional wall motion abnormalities. Left    ventricular diastolic function parameters were normal.  - Mitral valve: There was mild to moderate regurgitation.  - Atrial septum: A small septal defect cannot be excluded. Doubt    clinical significance given normal size of right sided chambers.        Physical Exam VS:  BP 120/82 (BP Location: Left Arm, Patient Position: Sitting, Cuff Size: Normal)   Pulse 68   Ht  5' 3 (1.6 m)   Wt 164 lb (74.4 kg)   SpO2 96%   BMI 29.05 kg/m        Wt Readings from Last 3 Encounters:  06/11/24 164 lb (74.4 kg)  03/07/24 169 lb 2 oz (76.7 kg)  02/20/24 172 lb 3.2 oz (78.1 kg)    GEN: Well nourished, well developed in no acute distress NECK: No JVD; No carotid bruits CARDIAC: RRR, no murmurs, rubs, gallops RESPIRATORY:  Clear to auscultation without rales,  wheezing or rhonchi  ABDOMEN: Soft, non-tender, non-distended EXTREMITIES:  No edema; No deformity   ASSESSMENT AND PLAN  NSTEMI with MINOCA Patient was hospitalized in September 2025 with chest pain found to have troponin elevated greater than 2000.  Left heart cath showed minimal irregularities with no evidence of obstructive CAD, low normal LVSF, mildly elevated LVEDP.  Echo showed EF of 45 to 50%, suspected stress-induced cardiomyopathy.  She was started on DAPT with aspirin  and Plavix  for 6 to 12 months.  Patient just finished cardiac rehab and plans to continue exercise at [employer].  She denies any chest pain or shortness of breath.  Continue aspirin , Plavix , Crestor , Toprol .  HFmrEF/stress-induced cardiomyopathy Echo showed LVEF 45 to 50% suspected secondary due to stress-induced cardiomyopathy.  Repeat limited echo showed normalized EF, grade 1 diastolic dysfunction.  We will continue Farxiga  10 mg daily and Toprol  12.5 mg daily.  Patient did not tolerate losartan .  Spironolactone was held due to history of hyperkalemia.  HTN Blood pressure 120/82.  Continue Toprol  and Farxiga .  HLD LDL 41. Continue Crestor  20mg  daily.   Hyperkalemia Losartan  stopped with improvement of K.         Dispo: Follow-up in 6 months  Signed, Jozee Hammer VEAR Fishman, PA-C   "

## 2024-06-11 NOTE — Patient Instructions (Signed)
 Medication Instructions:  Your physician recommends that you continue on your current medications as directed. Please refer to the Current Medication list given to you today.    *If you need a refill on your cardiac medications before your next appointment, please call your pharmacy*  Lab Work: No labs ordered today   Testing/Procedures: No test ordered today   Follow-Up: At [facility], you and your health needs are our priority.  As part of our continuing mission to provide you with exceptional heart care, our providers are all part of one team.  This team includes your primary Cardiologist (physician) and Advanced Practice Providers or APPs (Physician Assistants and Nurse Practitioners) who all work together to provide you with the care you need, when you need it.  Your next appointment:   6 month(s)  Provider:   Toribio Frees, PA-C
